# Patient Record
Sex: Female | Born: 1960 | Race: Black or African American | Hispanic: No | Marital: Single | Smoking: Former smoker
Health system: Southern US, Community
[De-identification: ages and names within clinical notes are randomized; demographics above are authoritative.]

## PROBLEM LIST (undated history)

## (undated) DIAGNOSIS — K8020 Calculus of gallbladder without cholecystitis without obstruction: Secondary | ICD-10-CM

## (undated) HISTORY — DX: Calculus of gallbladder without cholecystitis without obstruction: K80.20

---

## 1985-06-17 HISTORY — PX: CARPAL TUNNEL RELEASE: SHX101

## 1998-08-23 ENCOUNTER — Encounter: Payer: Self-pay | Admitting: General Surgery

## 1998-08-23 ENCOUNTER — Ambulatory Visit (HOSPITAL_COMMUNITY): Admission: RE | Admit: 1998-08-23 | Discharge: 1998-08-23 | Payer: Self-pay | Admitting: General Surgery

## 2000-12-01 ENCOUNTER — Other Ambulatory Visit: Admission: RE | Admit: 2000-12-01 | Discharge: 2000-12-01 | Payer: Self-pay | Admitting: Obstetrics and Gynecology

## 2000-12-01 ENCOUNTER — Encounter (INDEPENDENT_AMBULATORY_CARE_PROVIDER_SITE_OTHER): Payer: Self-pay

## 2001-11-10 ENCOUNTER — Inpatient Hospital Stay (HOSPITAL_COMMUNITY): Admission: AD | Admit: 2001-11-10 | Discharge: 2001-11-10 | Payer: Self-pay | Admitting: Obstetrics and Gynecology

## 2002-03-17 ENCOUNTER — Inpatient Hospital Stay (HOSPITAL_COMMUNITY): Admission: AD | Admit: 2002-03-17 | Discharge: 2002-03-20 | Payer: Self-pay | Admitting: Obstetrics and Gynecology

## 2002-07-16 ENCOUNTER — Ambulatory Visit (HOSPITAL_BASED_OUTPATIENT_CLINIC_OR_DEPARTMENT_OTHER): Admission: RE | Admit: 2002-07-16 | Discharge: 2002-07-16 | Payer: Self-pay | Admitting: Orthopedic Surgery

## 2004-02-15 ENCOUNTER — Ambulatory Visit (HOSPITAL_COMMUNITY): Admission: RE | Admit: 2004-02-15 | Discharge: 2004-02-15 | Payer: Self-pay | Admitting: Obstetrics and Gynecology

## 2004-02-22 ENCOUNTER — Encounter: Admission: RE | Admit: 2004-02-22 | Discharge: 2004-02-22 | Payer: Self-pay | Admitting: Obstetrics and Gynecology

## 2005-01-09 ENCOUNTER — Other Ambulatory Visit: Admission: RE | Admit: 2005-01-09 | Discharge: 2005-01-09 | Payer: Self-pay | Admitting: Family Medicine

## 2005-02-21 ENCOUNTER — Encounter: Admission: RE | Admit: 2005-02-21 | Discharge: 2005-02-21 | Payer: Self-pay | Admitting: Family Medicine

## 2006-03-06 ENCOUNTER — Other Ambulatory Visit: Admission: RE | Admit: 2006-03-06 | Discharge: 2006-03-06 | Payer: Self-pay | Admitting: Family Medicine

## 2006-03-26 ENCOUNTER — Encounter: Admission: RE | Admit: 2006-03-26 | Discharge: 2006-03-26 | Payer: Self-pay | Admitting: Family Medicine

## 2007-07-27 ENCOUNTER — Encounter: Admission: RE | Admit: 2007-07-27 | Discharge: 2007-07-27 | Payer: Self-pay | Admitting: Physician Assistant

## 2007-07-27 ENCOUNTER — Other Ambulatory Visit: Admission: RE | Admit: 2007-07-27 | Discharge: 2007-07-27 | Payer: Self-pay | Admitting: Family Medicine

## 2008-07-27 ENCOUNTER — Other Ambulatory Visit: Admission: RE | Admit: 2008-07-27 | Discharge: 2008-07-27 | Payer: Self-pay | Admitting: Family Medicine

## 2008-08-08 ENCOUNTER — Encounter: Admission: RE | Admit: 2008-08-08 | Discharge: 2008-08-08 | Payer: Self-pay | Admitting: Family Medicine

## 2009-04-19 ENCOUNTER — Encounter: Admission: RE | Admit: 2009-04-19 | Discharge: 2009-04-19 | Payer: Self-pay | Admitting: Family Medicine

## 2009-08-09 ENCOUNTER — Encounter: Admission: RE | Admit: 2009-08-09 | Discharge: 2009-08-09 | Payer: Self-pay | Admitting: Family Medicine

## 2010-07-07 ENCOUNTER — Encounter: Payer: Self-pay | Admitting: Family Medicine

## 2010-08-24 ENCOUNTER — Other Ambulatory Visit: Payer: Self-pay | Admitting: Family Medicine

## 2010-08-24 DIAGNOSIS — Z1231 Encounter for screening mammogram for malignant neoplasm of breast: Secondary | ICD-10-CM

## 2010-08-31 ENCOUNTER — Other Ambulatory Visit: Payer: Self-pay | Admitting: Family Medicine

## 2010-08-31 ENCOUNTER — Ambulatory Visit
Admission: RE | Admit: 2010-08-31 | Discharge: 2010-08-31 | Disposition: A | Discharge status: Home / Self Care | Payer: BC Managed Care – PPO | Source: Ambulatory Visit | Attending: Family Medicine | Admitting: Family Medicine

## 2010-08-31 DIAGNOSIS — N6325 Unspecified lump in the left breast, overlapping quadrants: Secondary | ICD-10-CM

## 2010-08-31 DIAGNOSIS — Z1231 Encounter for screening mammogram for malignant neoplasm of breast: Secondary | ICD-10-CM

## 2010-09-07 ENCOUNTER — Other Ambulatory Visit: Payer: Self-pay | Admitting: Family Medicine

## 2010-09-07 ENCOUNTER — Ambulatory Visit
Admission: RE | Admit: 2010-09-07 | Discharge: 2010-09-07 | Disposition: A | Discharge status: Home / Self Care | Payer: BC Managed Care – PPO | Source: Ambulatory Visit | Attending: Family Medicine | Admitting: Family Medicine

## 2010-09-07 DIAGNOSIS — N6325 Unspecified lump in the left breast, overlapping quadrants: Secondary | ICD-10-CM

## 2010-10-10 ENCOUNTER — Other Ambulatory Visit (HOSPITAL_COMMUNITY)
Admission: RE | Admit: 2010-10-10 | Discharge: 2010-10-10 | Disposition: A | Discharge status: Home / Self Care | Payer: BC Managed Care – PPO | Source: Ambulatory Visit | Attending: Family Medicine | Admitting: Family Medicine

## 2010-10-10 DIAGNOSIS — Z0000 Encounter for general adult medical examination without abnormal findings: Secondary | ICD-10-CM | POA: Insufficient documentation

## 2010-11-02 NOTE — Op Note (Signed)
   NAME:  Marissa Silva, Marissa Silva                          ACCOUNT NO.:  [id number]   MEDICAL RECORD NO.:  [id number]                   PATIENT TYPE:  INP   LOCATION:  9161                                 FACILITY:  [facility]   PHYSICIAN:  Lenoard Aden, M.D.             DATE OF BIRTH:  02-07-1961   DATE OF PROCEDURE:  03/18/2002  DATE OF DISCHARGE:                                 OPERATIVE REPORT   INDICATION FOR OPERATIVE DELIVERY:  Nonreassuring fetal heart rate tracing.   PROCEDURE:  Outlet vacuum-assisted vaginal delivery.   DESCRIPTION OF PROCEDURE:  After being apprised of the risks and benefits of  vacuum assistance which includes possible risk of scalp lacerations,  subdural hematoma, intracranial hemorrhage, the patient and grandmother who  are present consent to the performance of vacuum assistance.  Fetal vertex  LOA less than 25 degrees, +3 to +4 station, catheter previously removed,  within five minutes of vacuum assistance recommendation.  Fetal heart tones  noted to be in the 150-160 beat per minute range with poor beat-to-beat  variability indication of late to variable decelerations.  The Mityvac  mushroom cup is placed for one pull over second degree midline laceration.  Full-term living female, Apgars 8 and 9.  Cord blood collected.  Unable to  cord pH due to clots in the umbilical area.  Placenta delivered  spontaneously and intact three-vessel cord noted.  No vaginal or cervical  lacerations noted.  Repair using a 3-0 and 2-0 Rapide.  No complications.  Estimated blood loss 600 cc.  Bulb suctioning performed after delivery of  the fetal vertex.  Mother and baby recovering.                                                Lenoard Aden, M.D.    RJT/MEDQ  D:  03/18/2002  T:  03/18/2002  Job:  [id number]

## 2010-11-02 NOTE — Op Note (Signed)
NAME:  Marissa Silva, Marissa Silva                          ACCOUNT NO.:  [id number]   MEDICAL RECORD NO.:  [id number]                   PATIENT TYPE:  AMB   LOCATION:  DSC                                  FACILITY:  [facility]   PHYSICIAN:  Katy Fitch. Naaman Plummer., M.D.          DATE OF BIRTH:  [birth year]   DATE OF PROCEDURE:  07/16/2002  DATE OF DISCHARGE:                                 OPERATIVE REPORT   PREOPERATIVE DIAGNOSIS:  Stenosing tenosynovitis, left first dorsal  compartment.   POSTOPERATIVE DIAGNOSIS:  Stenosing tenosynovitis, left first dorsal  compartment.   OPERATION:  Release of left first dorsal compartment.   OPERATING SURGEON:  Katy Fitch. Sypher, M.D.   ASSISTANT:  Jonni Sanger, P.A.   ANESTHESIA:  12% Marcaine and 2% Lidocaine field block, left wrist,  supplemented by oxidation, supervised by Dr. Gelene Mink.   INDICATIONS:  Marissa Silva is a 50 year old woman who has a history of  painful swelling on the radial aspect of her left wrist.  She has failed  nonoperative measures including splinting, anti-inflammatory medication and  activity modification.   Due to the failure to respond to nonoperative measures, she is brought to  the operating room at this time for release of her left first dorsal  compartment.   PROCEDURE:  Marissa Silva is brought to the operating room and placed in the  supine position on the operating table.  Following light sedation, the left  arm was prepped with Betadine solution and sterilely draped.  On  exsanguination of the arm with the Esmarch bandage, our total tourniquet on  the proximal brachium was inflated to 220 mmHg.  Surgery commenced with a  transverse incision directly over the palpably enlarged first dorsal  compartment.  Subcutaneous tissue was carefully divided, revealed the radial  sensory branches.  There were gently retracted.  The enlarged compartment  was easily identified.  The compartment was split longitudinally with  scalpel and scissors revealing two slips of the abductor pollicis longus.  Dorsal resection revealed a second dorsal compartment with a thick septum  between the extensor pollicis brevis and abductor pollicis longus tendons.  The dorsal compartment was split longitudinally and the septum between the  compartments resected.  Thereafter, the tendons regained full range of  motion with free glide.   The wound was inspected for bleeding points and subsequently repaired with  intradermal 3-0 Prolene and a Steri-Strip.  __________ compression was  applied with sterile gauze and Ace wrap.  There were no apparent  complications.    AFTERCARE:  Marissa Silva is given prescription for Vicodin 5 mg 1-2 tablet p.o.  q.4-6h. p.r.n. pain, 20 tablets without refill.  She will return to our  office in followup in 7-10 days.  Katy Fitch Naaman Plummer., M.D.    RVS/MEDQ  D:  07/16/2002  T:  07/16/2002  Job:  [id number]

## 2011-05-03 ENCOUNTER — Other Ambulatory Visit: Payer: Self-pay | Admitting: Gastroenterology

## 2011-05-03 DIAGNOSIS — R109 Unspecified abdominal pain: Secondary | ICD-10-CM

## 2011-05-06 ENCOUNTER — Ambulatory Visit
Admission: RE | Admit: 2011-05-06 | Discharge: 2011-05-06 | Disposition: A | Discharge status: Home / Self Care | Payer: BC Managed Care – PPO | Source: Ambulatory Visit | Attending: Gastroenterology | Admitting: Gastroenterology

## 2011-05-06 DIAGNOSIS — R109 Unspecified abdominal pain: Secondary | ICD-10-CM

## 2011-05-21 ENCOUNTER — Ambulatory Visit (INDEPENDENT_AMBULATORY_CARE_PROVIDER_SITE_OTHER): Payer: BC Managed Care – PPO | Admitting: General Surgery

## 2011-05-21 ENCOUNTER — Encounter (INDEPENDENT_AMBULATORY_CARE_PROVIDER_SITE_OTHER): Payer: Self-pay | Admitting: General Surgery

## 2011-05-21 VITALS — BP 142/88 | HR 72 | Temp 97.6°F | Resp 18 | Ht 63.0 in | Wt 183.6 lb

## 2011-05-21 DIAGNOSIS — K8020 Calculus of gallbladder without cholecystitis without obstruction: Secondary | ICD-10-CM

## 2011-05-21 NOTE — H&P (Signed)
Demographics  Specialty CommentsEditShow AllReport    TEARSA KOWALEWSKI 50 year old female  Comm Pref: None [address]  [place] [address] [phone] (M)  Works at [employer] 05/21/11-Pt signed PHI: Madaline Guthrie Thompson(Mother)-02/02/42---  Problem List   MedicationsLong-Term       None  None  Significant History/Details   &&&Relevant Labs (3 years)      None   Smoking: Former Smoker    Smokeless Tobacco: Unknown    Alcohol: No    No open orders    Language: English            &&&Relevant Encounters (Maximum of 10 visits)   Date Type Department Provider Description  08/23/1998 [facility] Conversion Encounter DEFAULT LOCATION Iona Coach, MD         My Last Outpatient Progress Note   Status Last Edited Encounter Date  Pended Tue May 21, 2011 5:11 PM EST 05/21/2011  Subjective:    Patient ID: Marden Noble, female DOB: 1960/09/12, 50 y.o. MRN: [id number]  HPIMrs. Ruthellen Tippy is a 50 year old female who was referred to stand down for symptomatic gallstones patient states that she didn't notice an epigastric discomfort usually following eating and bloating of her abdomen for some months and she had a severe episode of vomiting fried chicken in October and they had tried all probiotics that did not make any change in her symptoms. She was written a same Merri Brunette and she referred the patient to stay in Dr Evette Cristal and he felt that her symptoms were most likely related to her gallbladder pain ultrasound did show multiple stones within her gallbladder. The common bile duct was not dilated and he recommended she see a surgeon for cholecystectomy  Past medical history she previously has been a cigarette smoking quit about 1990 she works for [employer] and she has one daughter age 52 the patient is single and is not known whether there is a lot of family members with gallbladder disease or not she has not had a recent  chest x-ray but denies any shortness of breath asthma. Her last menstrual period was over a year ago and most likely she is going through menopause denies any urinary type symptoms. Not sure that she's had any blood work since I don't have access to it and she denies any hypertension or angina-type symptoms.  Review of Systems    No current outpatient prescriptions on file.     No Known Allergies    Past Surgical History    Procedure  Date    .  Carpal tunnel release  1987      bilateral         Objective:     Physical ExamBP 142/88  Pulse 72  Temp(Src) 97.6 F (36.4 C) (Temporal)  Resp 18  Ht 5\' 3"  (1.6 m)  Wt 183 lb 9.6 oz (83.28 kg)  BMI 32.52 kg/m2  His examination shows a female appears her stated age in no acute distress he describes pain so in the mid epigastric area and sometimes goes up between her breasts anesthesia occurs after eating oral examination is unremarkable there is no carotid bruit or some eyes ears nose and throat are negative good breath sounds bilaterally I did not do a breast exam. Cardiac normal sinus rhythm abdomen she's not acutely tender pressure in the right upper quadrant and I don't appreciate any umbilical or  hernias in the lower abdomen. I did not do a rectal pelvic examination normal. She's got a history of lower back pain but I think the pain is lower than I would expect if there would be any common duct stones..  I think she's got symptomatic gallstones and I would recommend a laparoscopic cholecystectomy with cholangiogram at [facility] in the near future. The patient states that she is dizzy it worked until after the exams but should be able to get her surgery done for Christmas and will arrange to see if somebody could plan will be off her the night of surgery so she could go home the day of surgery provided that there were no stones in the gallbladder      Assessment:      Symptomatic gallstones I reviewed the patient's ultrasound with her and also  reve little booklet on biliary he has a copy of that booklet. She is not allergic to penicillin and sugar talked toe first at this time      Plan:      See note above      Care Team and Communications    Referring Provider    Graylin Shiver    PCPs Type   Candace Darolyn [facility]   Other Patient Care Team Members Relationship   None    Visit Treatment Team Relationship   Iona Coach, MD N/A   Recipients of Past Communications    None

## 2011-05-21 NOTE — Progress Notes (Signed)
Subjective:     Patient ID: Marissa Silva, female   DOB: [birth year], 50 y.o.   MRN: [id number]  HPIMrs. Marissa Silva is a 50 year old female who was referred to stand down for symptomatic gallstones patient states that she didn't notice an epigastric discomfort usually following eating and bloating of her abdomen for some months and she had a severe episode of vomiting fried chicken in October and they had tried all probiotics that did not make any change in her symptoms. She was written a same Marissa Brunette and she referred the patient to stay in Dr Evette Cristal and he felt that her symptoms were most likely related to her gallbladder pain ultrasound did show multiple stones within her gallbladder. The common bile duct was not dilated and he recommended she see a surgeon for cholecystectomy  Past medical history she previously has been a cigarette smoking quit about 1990 she works for him [employer] and she has one daughter age 42 the patient is single and is not known whether there is a lot of family members with gallbladder disease or not she has not had a recent chest x-ray but denies any shortness of breath asthma. Her last menstrual period was over a year ago and most likely she is going through menopause denies any urinary type symptoms. Not sure that she's had any blood work since I don't have access to it and she denies any hypertension or angina-type symptoms.   Review of Systems No current outpatient prescriptions on file.   No Known Allergies Past Surgical History  Procedure Date  . Carpal tunnel release 1987    bilateral       Objective:   Physical ExamBP 142/88  Pulse 72  Temp(Src) 97.6 F (36.4 C) (Temporal)  Resp 18  Ht 5\' 3"  (1.6 m)  Wt 183 lb 9.6 oz (83.28 kg)  BMI 32.52 kg/m2 His examination shows a female appears her stated age in no acute distress he describes pain so in the mid epigastric area and sometimes goes up between her breasts anesthesia occurs after  eating oral examination is unremarkable there is no carotid bruit or some eyes ears nose and throat are negative good breath sounds bilaterally I did not do a breast exam. Cardiac normal sinus rhythm abdomen she's not acutely tender pressure in the right upper quadrant and I don't appreciate any umbilical or hernias in the lower abdomen. I did not do a rectal pelvic examination normal. She's got a history of lower back pain but I think the pain is lower than I would expect if there would be any common duct stones..  I think she's got symptomatic gallstones and I would recommend a laparoscopic cholecystectomy with cholangiogram at [facility] in the near future. The patient states that she is dizzy it worked until after the exams but should be able to get her surgery done for Christmas and will arrange to see if somebody could plan will be off her the night of surgery so she could go home the day of surgery provided that there were no stones in the gallbladder     Assessment:     Symptomatic gallstones I reviewed the patient's ultrasound with her and also reve little booklet on biliary he has a copy of that booklet. She is not allergic to penicillin and sugar talked toe first at this time    Plan:     See note above

## 2011-05-28 ENCOUNTER — Encounter (HOSPITAL_COMMUNITY): Payer: Self-pay | Admitting: Pharmacy Technician

## 2011-05-30 ENCOUNTER — Other Ambulatory Visit: Payer: Self-pay

## 2011-05-30 ENCOUNTER — Encounter (HOSPITAL_COMMUNITY)
Admission: RE | Admit: 2011-05-30 | Discharge: 2011-05-30 | Disposition: A | Discharge status: Home / Self Care | Payer: BC Managed Care – PPO | Source: Ambulatory Visit | Attending: General Surgery | Admitting: General Surgery

## 2011-05-30 ENCOUNTER — Encounter (HOSPITAL_COMMUNITY): Payer: Self-pay

## 2011-05-30 DIAGNOSIS — K8020 Calculus of gallbladder without cholecystitis without obstruction: Secondary | ICD-10-CM

## 2011-05-30 LAB — COMPREHENSIVE METABOLIC PANEL
ALT: 24 U/L (ref 0–35)
AST: 17 U/L (ref 0–37)
Albumin: 4 g/dL (ref 3.5–5.2)
Alkaline Phosphatase: 58 U/L (ref 39–117)
BUN: 13 mg/dL (ref 6–23)
Calcium: 9.9 mg/dL (ref 8.4–10.5)
Creatinine, Ser: 0.81 mg/dL (ref 0.50–1.10)
GFR calc Af Amer: >90 mL/min (ref >90)
Potassium: 4.2 mEq/L (ref 3.5–5.1)
Total Protein: 7.9 g/dL (ref 6.0–8.3)

## 2011-05-30 LAB — DIFFERENTIAL
Eosinophils Relative: 1 % (ref 0–5)
Lymphocytes Relative: 47 % — ABNORMAL HIGH (ref 12–46)
Lymphs Abs: 2.4 10*3/uL (ref 0.7–4.0)
Monocytes Relative: 7 % (ref 3–12)
Neutro Abs: 2.3 10*3/uL (ref 1.7–7.7)

## 2011-05-30 LAB — CBC
MCHC: 33.3 g/dL (ref 30.0–36.0)
RDW: 12.9 % (ref 11.5–15.5)

## 2011-05-30 NOTE — Patient Instructions (Signed)
20 Marissa Silva  05/30/2011   Your procedure is scheduled on:  Friday 05/31/11 AT 7:45 AM  Report to [facility] at 5:45 AM.  Call this number if you have problems the morning of surgery: [phone]   Remember:   Do not eat food OR DRINK ANYTHING AFTER MIDNIGHT-THE NIGHT BEFORE YOUR SURGERY.       Do not wear jewelry, make-up or nail polish.  Do not wear lotions, powders, or perfumes. You may wear deodorant.  Do not shave 48 hours prior to surgery.  Do not bring valuables to the hospital.  Contacts, dentures or bridgework may not be worn into surgery.  Leave suitcase in the car. After surgery it may be brought to your room.  For patients admitted to the hospital, checkout time is 11:00 AM the day of discharge.   Patients discharged the day of surgery will not be allowed to drive home.  Name and phone number of your driver: PATIENT'S MOTHER WILL BE WITH HER  Special Instructions: CHG Shower Use Special Wash: 1/2 bottle night before surgery and 1/2 bottle morning of surgery.   Please read over the following fact sheets that you were given: MRSA Information

## 2011-05-30 NOTE — Pre-Procedure Instructions (Signed)
EKG WAS DONE TODAY IN PREOP AT [facility] AS PER ORDER DR. Zachery Dakins CXR NOT NEEDED-PER ANESTHESIOLOGIST GUIDELINES

## 2011-05-31 ENCOUNTER — Ambulatory Visit (HOSPITAL_COMMUNITY): Payer: BC Managed Care – PPO

## 2011-05-31 ENCOUNTER — Encounter (HOSPITAL_COMMUNITY)
Admission: RE | Disposition: A | Discharge status: Home / Self Care | Payer: Self-pay | Source: Ambulatory Visit | Attending: General Surgery

## 2011-05-31 ENCOUNTER — Encounter (HOSPITAL_COMMUNITY): Payer: Self-pay | Admitting: *Deleted

## 2011-05-31 ENCOUNTER — Other Ambulatory Visit (INDEPENDENT_AMBULATORY_CARE_PROVIDER_SITE_OTHER): Payer: Self-pay | Admitting: General Surgery

## 2011-05-31 ENCOUNTER — Encounter (HOSPITAL_COMMUNITY): Payer: Self-pay | Admitting: Anesthesiology

## 2011-05-31 ENCOUNTER — Ambulatory Visit (HOSPITAL_COMMUNITY): Payer: BC Managed Care – PPO | Admitting: Anesthesiology

## 2011-05-31 ENCOUNTER — Ambulatory Visit (HOSPITAL_COMMUNITY)
Admission: RE | Admit: 2011-05-31 | Discharge: 2011-05-31 | Disposition: A | Discharge status: Home / Self Care | Payer: BC Managed Care – PPO | Source: Ambulatory Visit | Attending: General Surgery | Admitting: General Surgery

## 2011-05-31 DIAGNOSIS — R1110 Vomiting, unspecified: Secondary | ICD-10-CM | POA: Insufficient documentation

## 2011-05-31 DIAGNOSIS — K8010 Calculus of gallbladder with chronic cholecystitis without obstruction: Secondary | ICD-10-CM | POA: Insufficient documentation

## 2011-05-31 DIAGNOSIS — M545 Low back pain, unspecified: Secondary | ICD-10-CM | POA: Insufficient documentation

## 2011-05-31 DIAGNOSIS — K8020 Calculus of gallbladder without cholecystitis without obstruction: Secondary | ICD-10-CM

## 2011-05-31 DIAGNOSIS — R1013 Epigastric pain: Secondary | ICD-10-CM | POA: Insufficient documentation

## 2011-05-31 DIAGNOSIS — K838 Other specified diseases of biliary tract: Secondary | ICD-10-CM | POA: Insufficient documentation

## 2011-05-31 DIAGNOSIS — R42 Dizziness and giddiness: Secondary | ICD-10-CM | POA: Insufficient documentation

## 2011-05-31 DIAGNOSIS — Z01810 Encounter for preprocedural cardiovascular examination: Secondary | ICD-10-CM | POA: Insufficient documentation

## 2011-05-31 DIAGNOSIS — Z01812 Encounter for preprocedural laboratory examination: Secondary | ICD-10-CM | POA: Insufficient documentation

## 2011-05-31 HISTORY — PX: CHOLECYSTECTOMY: SHX55

## 2011-05-31 SURGERY — LAPAROSCOPIC CHOLECYSTECTOMY WITH INTRAOPERATIVE CHOLANGIOGRAM
Anesthesia: General | Site: Abdomen | Wound class: Clean Contaminated

## 2011-05-31 MED ORDER — NEOSTIGMINE METHYLSULFATE 1 MG/ML IJ SOLN
INTRAMUSCULAR | Status: DC | PRN
  Administered 2011-05-31: 4 mg via INTRAVENOUS

## 2011-05-31 MED ORDER — ONDANSETRON HCL 4 MG/2ML IJ SOLN
INTRAMUSCULAR | Status: DC | PRN
  Administered 2011-05-31: 4 mg via INTRAVENOUS

## 2011-05-31 MED ORDER — HYDROMORPHONE HCL PF 1 MG/ML IJ SOLN
0.2500 mg | INTRAMUSCULAR | Status: DC | PRN
  Administered 2011-05-31 (×4): 0.5 mg via INTRAVENOUS

## 2011-05-31 MED ORDER — LACTATED RINGERS IV SOLN: INTRAVENOUS | Status: DC

## 2011-05-31 MED ORDER — GLYCOPYRROLATE 0.2 MG/ML IJ SOLN
INTRAMUSCULAR | Status: DC | PRN
  Administered 2011-05-31: .6 mg via INTRAVENOUS

## 2011-05-31 MED ORDER — FENTANYL CITRATE 0.05 MG/ML IJ SOLN
INTRAMUSCULAR | Status: DC | PRN
  Administered 2011-05-31: 50 ug via INTRAVENOUS
  Administered 2011-05-31 (×2): 100 ug via INTRAVENOUS

## 2011-05-31 MED ORDER — ROCURONIUM BROMIDE 100 MG/10ML IV SOLN
INTRAVENOUS | Status: DC | PRN
  Administered 2011-05-31: 50 mg via INTRAVENOUS

## 2011-05-31 MED ORDER — HYDROMORPHONE HCL PF 1 MG/ML IJ SOLN
INTRAMUSCULAR | Status: AC
  Filled 2011-05-31: qty 1

## 2011-05-31 MED ORDER — IOHEXOL 300 MG/ML  SOLN
INTRAMUSCULAR | Status: DC | PRN
  Administered 2011-05-31: 3 mL via INTRAVENOUS

## 2011-05-31 MED ORDER — HYDROCODONE-ACETAMINOPHEN 5-500 MG PO TABS: 1.0000 | ORAL_TABLET | ORAL | Status: AC | PRN

## 2011-05-31 MED ORDER — ACETAMINOPHEN 10 MG/ML IV SOLN
INTRAVENOUS | Status: DC | PRN
  Administered 2011-05-31: 1000 mg via INTRAVENOUS

## 2011-05-31 MED ORDER — ACETAMINOPHEN 10 MG/ML IV SOLN
INTRAVENOUS | Status: AC
  Filled 2011-05-31: qty 100

## 2011-05-31 MED ORDER — PROMETHAZINE HCL 25 MG/ML IJ SOLN: 6.2500 mg | INTRAMUSCULAR | Status: DC | PRN

## 2011-05-31 MED ORDER — DEXTROSE 5 % IV SOLN
2.0000 g | INTRAVENOUS | Status: AC
  Administered 2011-05-31: 2 g via INTRAVENOUS
  Filled 2011-05-31: qty 2

## 2011-05-31 MED ORDER — PROPOFOL 10 MG/ML IV EMUL
INTRAVENOUS | Status: DC | PRN
  Administered 2011-05-31: 200 mg via INTRAVENOUS

## 2011-05-31 MED ORDER — IOHEXOL 300 MG/ML  SOLN
INTRAMUSCULAR | Status: AC
  Filled 2011-05-31: qty 1

## 2011-05-31 MED ORDER — LIDOCAINE HCL (CARDIAC) 20 MG/ML IV SOLN
INTRAVENOUS | Status: DC | PRN
  Administered 2011-05-31: 80 mg via INTRAVENOUS

## 2011-05-31 MED ORDER — MIDAZOLAM HCL 5 MG/5ML IJ SOLN
INTRAMUSCULAR | Status: DC | PRN
  Administered 2011-05-31: 2 mg via INTRAVENOUS

## 2011-05-31 MED ORDER — CEFOXITIN SODIUM-DEXTROSE 1-4 GM-% IV SOLR (PREMIX)
INTRAVENOUS | Status: AC
  Filled 2011-05-31: qty 100

## 2011-05-31 MED ORDER — LACTATED RINGERS IV SOLN
INTRAVENOUS | Status: DC | PRN
  Administered 2011-05-31: 07:00:00 via INTRAVENOUS

## 2011-05-31 MED ORDER — BUPIVACAINE-EPINEPHRINE 0.25% -1:200000 IJ SOLN
INTRAMUSCULAR | Status: AC
  Filled 2011-05-31: qty 1

## 2011-05-31 SURGICAL SUPPLY — 35 items
APL SKNCLS STERI-STRIP NONHPOA (GAUZE/BANDAGES/DRESSINGS) ×1
APPLIER CLIP ROT 10 11.4 M/L (STAPLE) ×2
APR CLP MED LRG 11.4X10 (STAPLE) ×1
BAG SPEC RTRVL LRG 6X4 10 (ENDOMECHANICALS) ×1
BENZOIN TINCTURE PRP APPL 2/3 (GAUZE/BANDAGES/DRESSINGS) ×2 IMPLANT
CANISTER SUCTION 2500CC (MISCELLANEOUS) ×2 IMPLANT
CLIP APPLIE ROT 10 11.4 M/L (STAPLE) ×1 IMPLANT
CLOTH BEACON ORANGE TIMEOUT ST (SAFETY) ×2 IMPLANT
COVER MAYO STAND STRL (DRAPES) ×2 IMPLANT
DECANTER SPIKE VIAL GLASS SM (MISCELLANEOUS) ×2 IMPLANT
DRAPE C-ARM 42X72 X-RAY (DRAPES) ×2 IMPLANT
DRAPE LAPAROSCOPIC ABDOMINAL (DRAPES) ×2 IMPLANT
ELECT REM PT RETURN 9FT ADLT (ELECTROSURGICAL) ×2
ELECTRODE REM PT RTRN 9FT ADLT (ELECTROSURGICAL) ×1 IMPLANT
GLOVE BIOGEL PI IND STRL 7.0 (GLOVE) ×1 IMPLANT
GLOVE BIOGEL PI INDICATOR 7.0 (GLOVE) ×1
GLOVE ORTHO TXT STRL SZ7.5 (GLOVE) ×2 IMPLANT
GOWN PREVENTION PLUS XLARGE (GOWN DISPOSABLE) ×2 IMPLANT
GOWN STRL NON-REIN LRG LVL3 (GOWN DISPOSABLE) ×3 IMPLANT
GOWN STRL REIN XL XLG (GOWN DISPOSABLE) ×3 IMPLANT
HEMOSTAT SURGICEL 4X8 (HEMOSTASIS) IMPLANT
KIT BASIN OR (CUSTOM PROCEDURE TRAY) ×2 IMPLANT
NS IRRIG 1000ML POUR BTL (IV SOLUTION) ×1 IMPLANT
POUCH SPECIMEN RETRIEVAL 10MM (ENDOMECHANICALS) ×2 IMPLANT
SET CHOLANGIOGRAPH MIX (MISCELLANEOUS) ×2 IMPLANT
SET IRRIG TUBING LAPAROSCOPIC (IRRIGATION / IRRIGATOR) ×2 IMPLANT
SOLUTION ANTI FOG 6CC (MISCELLANEOUS) ×2 IMPLANT
STRIP CLOSURE SKIN 1/2X4 (GAUZE/BANDAGES/DRESSINGS) ×2 IMPLANT
SUT VIC AB 0 UR5 27 (SUTURE) ×1 IMPLANT
SUT VIC AB 4-0 PS2 27 (SUTURE) ×2 IMPLANT
TRAY LAP CHOLE (CUSTOM PROCEDURE TRAY) ×2 IMPLANT
TROCAR BLADELESS OPT 5 75 (ENDOMECHANICALS) ×4 IMPLANT
TROCAR XCEL BLUNT TIP 100MML (ENDOMECHANICALS) ×2 IMPLANT
TROCAR XCEL NON-BLD 11X100MML (ENDOMECHANICALS) ×2 IMPLANT
TUBING INSUFFLATION 10FT LAP (TUBING) ×2 IMPLANT

## 2011-05-31 NOTE — Transfer of Care (Signed)
Immediate Anesthesia Transfer of Care Note  Patient: Marissa Silva  Procedure(s) Performed:  LAPAROSCOPIC CHOLECYSTECTOMY WITH INTRAOPERATIVE CHOLANGIOGRAM  Patient Location: PACU  Anesthesia Type: General  Level of Consciousness: sedated and patient cooperative  Airway & Oxygen Therapy: Patient Spontanous Breathing and Patient connected to face mask oxygen  Post-op Assessment: Report given to PACU RN, Post -op Vital signs reviewed and stable and Patient moving all extremities  Post vital signs: Reviewed and stable  Complications: No apparent anesthesia complications

## 2011-05-31 NOTE — Anesthesia Preprocedure Evaluation (Addendum)
Anesthesia Evaluation  Patient identified by MRN, date of birth, ID band Patient awake    Reviewed: Allergy & Precautions, H&P , NPO status , Patient's Chart, lab work & pertinent test results  Airway Mallampati: II TM Distance: >3 FB Neck ROM: full    Dental No notable dental hx. (+) Teeth Intact   Pulmonary neg pulmonary ROS,  clear to auscultation  Pulmonary exam normal       Cardiovascular Exercise Tolerance: Good neg cardio ROS regular Normal    Neuro/Psych Negative Neurological ROS  Negative Psych ROS   GI/Hepatic negative GI ROS, Neg liver ROS, PUD,   Endo/Other  Negative Endocrine ROS  Renal/GU negative Renal ROS  Genitourinary negative   Musculoskeletal   Abdominal   Peds  Hematology negative hematology ROS (+)   Anesthesia Other Findings   Reproductive/Obstetrics negative OB ROS                          Anesthesia Physical Anesthesia Plan  ASA: I  Anesthesia Plan: General   Post-op Pain Management:    Induction: Intravenous  Airway Management Planned: Oral ETT  Additional Equipment:   Intra-op Plan:   Post-operative Plan: Extubation in OR  Informed Consent: I have reviewed the patients History and Physical, chart, labs and discussed the procedure including the risks, benefits and alternatives for the proposed anesthesia with the patient or authorized representative who has indicated his/her understanding and acceptance.   Dental Advisory Given  Plan Discussed with: CRNA and Surgeon  Anesthesia Plan Comments:        Anesthesia Quick Evaluation

## 2011-05-31 NOTE — H&P (View-Only) (Signed)
      Demographics  Specialty CommentsEditShow AllReport    Marissa Silva 50 year old female  Comm Pref: None [address]  [place] [address] [phone] (H) [phone] (W) [phone] (M)  Works at [employer] 05/21/11-Pt signed PHI: Marissa Silva(Mother)-[birth year]42---  Problem List   MedicationsLong-Term       None  None  Significant History/Details   &&&Relevant Labs (3 years)      None   Smoking: Former Smoker    Smokeless Tobacco: Unknown    Alcohol: No    No open orders    Language: English            &&&Relevant Encounters (Maximum of 10 visits)   Date Type Department Provider Description  08/23/1998 [facility] Conversion Encounter DEFAULT LOCATION Rhanda Lemire J, MD         My Last Outpatient Progress Note   Status Last Edited Encounter Date  Pended Tue May 21, 2011 5:11 PM EST 05/21/2011  Subjective:    Patient ID: Marissa Silva, female DOB: 02/21/1961, 50 y.o. MRN: [id number]  HPIMrs. Marissa Silva is a 50-year-old female who was referred to stand down for symptomatic gallstones patient states that she didn't notice an epigastric discomfort usually following eating and bloating of her abdomen for some months and she had a severe episode of vomiting fried chicken in October and they had tried all probiotics that did not make any change in her symptoms. She was written a same Candace Jiron and she referred the patient to stay in Dr Ganem and he felt that her symptoms were most likely related to her gallbladder pain ultrasound did show multiple stones within her gallbladder. The common bile duct was not dilated and he recommended she see a surgeon for cholecystectomy  Past medical history she previously has been a cigarette smoking quit about 1990 she works for him [employer] and she has one daughter age 9 the patient is single and is not known whether there is a lot of family members with gallbladder disease or not she has not had a recent  chest x-ray but denies any shortness of breath asthma. Her last menstrual period was over a year ago and most likely she is going through menopause denies any urinary type symptoms. Not sure that she's had any blood work since I don't have access to it and she denies any hypertension or angina-type symptoms.  Review of Systems    No current outpatient prescriptions on file.     No Known Allergies    Past Surgical History    Procedure  Date    .  Carpal tunnel release  1987      bilateral         Objective:     Physical ExamBP 142/88  Pulse 72  Temp(Src) 97.6 F (36.4 C) (Temporal)  Resp 18  Ht 5' 3" (1.6 m)  Wt 183 lb 9.6 oz (83.28 kg)  BMI 32.52 kg/m2  His examination shows a female appears her stated age in no acute distress he describes pain so in the mid epigastric area and sometimes goes up between her breasts anesthesia occurs after eating oral examination is unremarkable there is no carotid bruit or some eyes ears nose and throat are negative good breath sounds bilaterally I did not do a breast exam. Cardiac normal sinus rhythm abdomen she's not acutely tender pressure in the right upper quadrant and I don't appreciate any umbilical or   hernias in the lower abdomen. I did not do a rectal pelvic examination normal. She's got a history of lower back pain but I think the pain is lower than I would expect if there would be any common duct stones..  I think she's got symptomatic gallstones and I would recommend a laparoscopic cholecystectomy with cholangiogram at  in the near future. The patient states that she is dizzy it worked until after the exams but should be able to get her surgery done for Christmas and will arrange to see if somebody could plan will be off her the night of surgery so she could go home the day of surgery provided that there were no stones in the gallbladder      Assessment:      Symptomatic gallstones I reviewed the patient's ultrasound with her and also  reve little booklet on biliary he has a copy of that booklet. She is not allergic to penicillin and sugar talked toe first at this time      Plan:      See note above      Care Team and Communications    Referring Provider    Salem F Ganem    PCPs Type   Candace Thiele [facility]   Other Patient Care Team Members Relationship   None    Visit Treatment Team Relationship   Hussain Maimone J Tieasha Larsen, MD N/A   Recipients of Past Communications    None       

## 2011-05-31 NOTE — Interval H&P Note (Signed)
History and Physical Interval Note:  05/31/2011 7:30 AM  Marissa Silva  has presented today for surgery, with the diagnosis of GALLSTONES  The various methods of treatment have been discussed with the patient and family. After consideration of risks, benefits and other options for treatment, the patient has consented to  Procedure(s): LAPAROSCOPIC CHOLECYSTECTOMY WITH INTRAOPERATIVE CHOLANGIOGRAM as a surgical intervention .  The patients' history has been reviewed, patient examined, no change in status, stable for surgery.  I have reviewed the patients' chart and labs.  Questions were answered to the patient's satisfaction.     Akeiba Axelson J Plan cholecystectomy today no attacks since seening pt in office not tender on reexamination this am. Wants to go home after or. BP 155/85  Pulse 79  Temp(Src) 97.2 F (36.2 C) (Oral)  Resp 18  SpO2 100%  Consuello Bossier

## 2011-05-31 NOTE — Preoperative (Signed)
Beta Blockers   Reason not to administer Beta Blockers:Not Applicable 

## 2011-05-31 NOTE — Op Note (Signed)
[name], [name] NO.:  [id number]  MEDICAL RECORD NO.:  [id number]  LOCATION:  [facility]                         FACILITY:  [facility]  PHYSICIAN:  Anselm Pancoast. Rani Idler, M.D.DATE OF BIRTH:  [birth year]  DATE OF PROCEDURE:  05/31/2011 DATE OF DISCHARGE:                              OPERATIVE REPORT   PREOPERATIVE DIAGNOSIS:  Chronic cholecystitis with stones.  POSTOPERATIVE DIAGNOSIS:  Chronic cholecystitis with stones.  OPERATIONS:  Laparoscopic cholecystectomy with cholangiogram.  SURGEON:  Anselm Pancoast. Zachery Dakins, M.D.  ASSISTANT:  Sandria Bales. Ezzard Standing, M.D.  HISTORY:  Marissa Silva is a 50 year old female who was referred to me due to symptomatic gallstones.  She had seen Dr. Merri Brunette and was having epigastric pain.  She was referred by Dr. __________ for consideration of upper endoscopy, he thought the symptoms were more consistent with gallstones and referred her for an ultrasound that showed multiple stones within her gallbladder.  The common bile duct was not dilated and he recommended that she see a surgeon and was referred to my office.  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  She said she has not had any further episodes of pain since I saw her in the office approximately 2 weeks ago.  Liver function studies were normal and she denied any medical allergies.  She was given 2 g of Mefoxin preoperatively.  On physical exam this morning, there was no evidence of any tenderness in the right upper quadrant, and we discussed that she does plan on going home after her surgery today.  They produced a little booklet in the office, but __________, etc.  The patient was taken to the operative suite.  Induction of general anesthesia, endotracheal tube, the abdomen was prepped with  Betadine solution and draped in sterile manner, after time-out was completed, planned procedure.  She received 2 g of Mefoxin and then a small incision was made below the umbilicus.  The fascia was identified, picked up between 2 Kochers and then a small opening made into the true peritoneal cavity through the peritoneum.  A pursestring suture was placed and a Hasson cannula introduced.  The gallbladder had a lot of stones, but not acutely inflamed, and the upper 10 mm trocar was placed under direct vision after anesthetizing the fascia and the 2 lateral 5- mm trocars were placed at the appropriate lateral position.  Dr. Ezzard Standing had scrubbed in at this time and retracted the gallbladder upward and outward in the proximal portion of the gallbladder cystic duct area.  I resected this and circled the cystic duct, there was a little blood vessel right on the cystic duct, then I elevated it, put 2 clips under it and then divided distally.  I then put a clip on the junction of the cystic duct gallbladder, opened the proximal cystic duct, and there was a little bit of debris __________, but not truly stones.  The catheter was introduced, held in place with clip.  The cholangiogram obtained, it showed  good prompt filling of the extrahepatic biliary system with no evidence of common duct stone and probably about a 3 cm cystic duct remaining.  I removed the catheter, dissected down on the cystic duct about 2 cm more proximal and then triply clipped the cystic duct at that point and then divided it distal to the clips.  All the little branches of the cystic artery were doubly clipped and then divided distal to the clips and then the gallbladder freed from its bed, and there was good hemostasis.  We inspected all the clips, bleeders found none, and thought everything was fine.  We placed the gallbladder in the Endocatch bag.  We then switched the camera to the upper 10-mm trocar and grabbed the bag.   I opened the fascia just a little larger to get the bag intact with the numerous stones and most of these were all kind of 2 to 3-mm type residual stones.  They states that __________ the pursestring came out, where I put 2 figure-of-eights of 0 Vicryl in the fascia and tied both and anesthetized the fascia.  The subcutaneous wounds, after the ports removed, were closed with 4-0 Vicryl and reinspection before removing the last 5-mm port showed no evidence of any bleeding and the remaining irrigating fluid had been aspirated.  The upper 10 mm trocar was withdrawn under direct vision and Steri-Strips and benzoin were placed on the skin.  The patient will be released after a short stay this morning, and I will see her back in the office next Friday for postoperative wound check.     Anselm Pancoast. Zachery Dakins, M.D.     WJW/MEDQ  D:  05/31/2011  T:  05/31/2011  Job:  [id number]  cc:   Dario Guardian, M.D. Fax: [phone]

## 2011-05-31 NOTE — Brief Op Note (Signed)
05/31/2011  9:05 AM  PATIENT:  Marissa Silva  50 y.o. female  PRE-OPERATIVE DIAGNOSIS:  GALLSTONES  POST-OPERATIVE DIAGNOSIS:  GALLSTONES  PROCEDURE:  Procedure(s): LAPAROSCOPIC CHOLECYSTECTOMY WITH INTRAOPERATIVE CHOLANGIOGRAM  SURGEON:  Surgeon(s): Iona Coach, MD    ASSISTANTS:David Ezzard Standing   ANESTHESIA:   general  EBL:     BLOOD ADMINISTERED:none  DRAINS: none   LOCAL MEDICATIONS USED:  MARCAINE 20CC  SPECIMEN:  Excision  DISPOSITION OF SPECIMEN:  PATHOLOGY  COUNTS:  YES  TOURNIQUET:  * No tourniquets in log *  DICTATION: .Other Dictation: Dictation Number [id number]  PLAN OF CARE: Discharge to home after PACU  PATIENT DISPOSITION:  PACU - hemodynamically stable.

## 2011-05-31 NOTE — Anesthesia Postprocedure Evaluation (Signed)
  Anesthesia Post-op Note  Patient: Marissa Silva  Procedure(s) Performed:  LAPAROSCOPIC CHOLECYSTECTOMY WITH INTRAOPERATIVE CHOLANGIOGRAM  Patient Location: PACU  Anesthesia Type: General  Level of Consciousness: awake and alert   Airway and Oxygen Therapy: Patient Spontanous Breathing  Post-op Pain: mild  Post-op Assessment: Post-op Vital signs reviewed, Patient's Cardiovascular Status Stable, Respiratory Function Stable, Patent Airway and No signs of Nausea or vomiting  Post-op Vital Signs: stable  Complications: No apparent anesthesia complications

## 2011-06-03 ENCOUNTER — Telehealth (INDEPENDENT_AMBULATORY_CARE_PROVIDER_SITE_OTHER): Payer: Self-pay

## 2011-06-03 NOTE — Telephone Encounter (Signed)
Patients mother called requesting a follow up appoint for daughter Marissa Silva. Cant see any availability. Please call patient with appointment [phone].

## 2011-06-05 ENCOUNTER — Encounter (HOSPITAL_COMMUNITY): Payer: Self-pay | Admitting: General Surgery

## 2011-06-06 ENCOUNTER — Encounter (INDEPENDENT_AMBULATORY_CARE_PROVIDER_SITE_OTHER): Payer: Self-pay | Admitting: General Surgery

## 2011-06-06 ENCOUNTER — Ambulatory Visit (INDEPENDENT_AMBULATORY_CARE_PROVIDER_SITE_OTHER): Payer: BC Managed Care – PPO | Admitting: General Surgery

## 2011-06-06 VITALS — BP 132/86 | HR 80 | Temp 97.8°F | Resp 16 | Ht 63.0 in | Wt 182.2 lb

## 2011-06-06 DIAGNOSIS — K8010 Calculus of gallbladder with chronic cholecystitis without obstruction: Secondary | ICD-10-CM

## 2011-06-06 NOTE — Progress Notes (Signed)
Patient ID: Marissa Silva, female   DOB: March 07, 1961, 50 y.o.   MRN: [id number] Observe what foods tend to cause loose bowel movements and adjust them as needed. The little incision lumps will dissolve it the next few weeks as the stitches dissolve. If there is any need for a followup visit with one of my partners in approximately one month please call to be reseen. merry Christmas BP 132/86  Pulse 80  Temp(Src) 97.8 F (36.6 C) (Temporal)  Resp 16  Ht 5\' 3"  (1.6 m)  Wt 182 lb 3.2 oz (82.645 kg)  BMI 32.28 kg/m2 [name] returns she is now first postop visit followup chart a cholecystectomy and cholangiogram were performed last week she was released after her surgery she had no stones in the common bile duct a lot of stones in her gallbladder and says since her surgery she's had no pain she has noticed that she has a little loose bowel movements intermittently with certain food. Her incisions are healing without evidence of problem and a little stitch in the subcutaneous tissue should resolve in the next few weeks. If she is still having any symptoms in approximately one month she can return to see one of my partners that I did not schedule her for a followup appointment

## 2011-09-30 ENCOUNTER — Other Ambulatory Visit: Payer: Self-pay | Admitting: Family Medicine

## 2011-09-30 ENCOUNTER — Other Ambulatory Visit (HOSPITAL_COMMUNITY): Payer: Self-pay | Admitting: Family Medicine

## 2011-09-30 DIAGNOSIS — Z1231 Encounter for screening mammogram for malignant neoplasm of breast: Secondary | ICD-10-CM

## 2011-10-11 ENCOUNTER — Ambulatory Visit: Payer: BC Managed Care – PPO

## 2011-10-11 ENCOUNTER — Ambulatory Visit
Admission: RE | Admit: 2011-10-11 | Discharge: 2011-10-11 | Disposition: A | Discharge status: Home / Self Care | Payer: BC Managed Care – PPO | Source: Ambulatory Visit | Attending: Family Medicine | Admitting: Family Medicine

## 2011-10-11 DIAGNOSIS — Z1231 Encounter for screening mammogram for malignant neoplasm of breast: Secondary | ICD-10-CM

## 2011-10-23 ENCOUNTER — Ambulatory Visit (HOSPITAL_COMMUNITY): Payer: BC Managed Care – PPO

## 2012-04-30 ENCOUNTER — Other Ambulatory Visit: Payer: Self-pay | Admitting: Obstetrics and Gynecology

## 2012-04-30 DIAGNOSIS — N644 Mastodynia: Secondary | ICD-10-CM

## 2013-04-21 IMAGING — US US ABDOMEN COMPLETE
1 series · 14 of 25 positions shown · non-contrast
Comparison: None.

CLINICAL DATA: Abdominal pain and discomfort.  Bloating.

COMPLETE ABDOMINAL ULTRASOUND

[Series 1: us abdomen complete · 0.26mm/px · 14 of 50 slices shown]
[im 1/50]
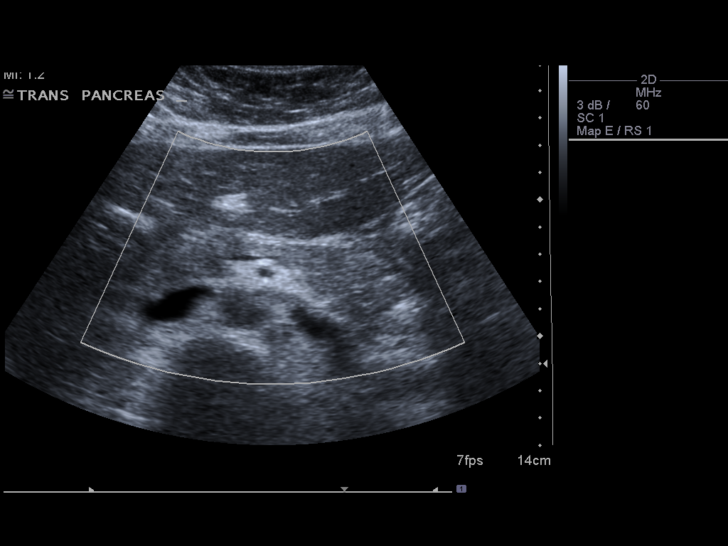
[im 5/50]
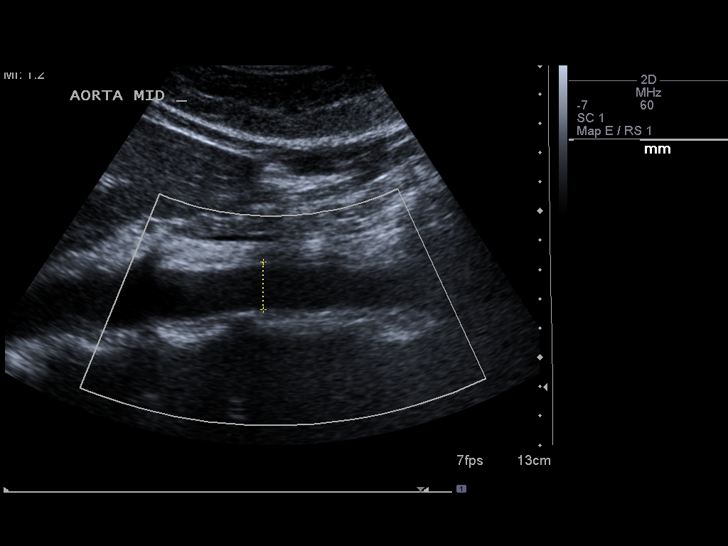
[im 9/50]
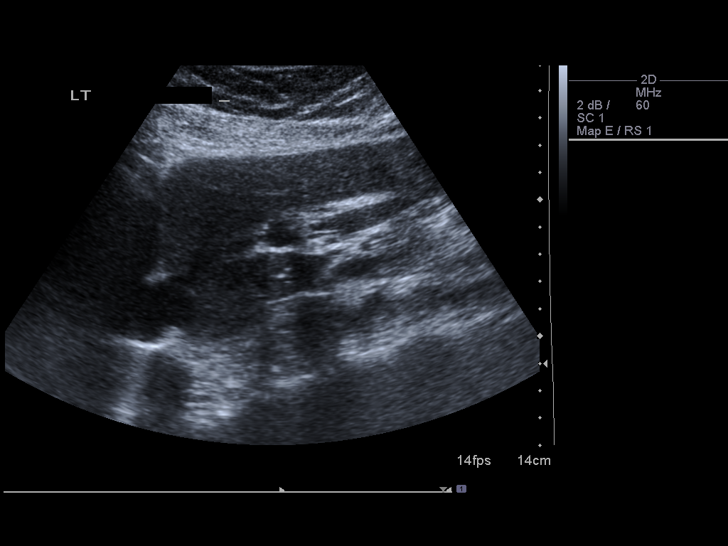
[im 13/50]
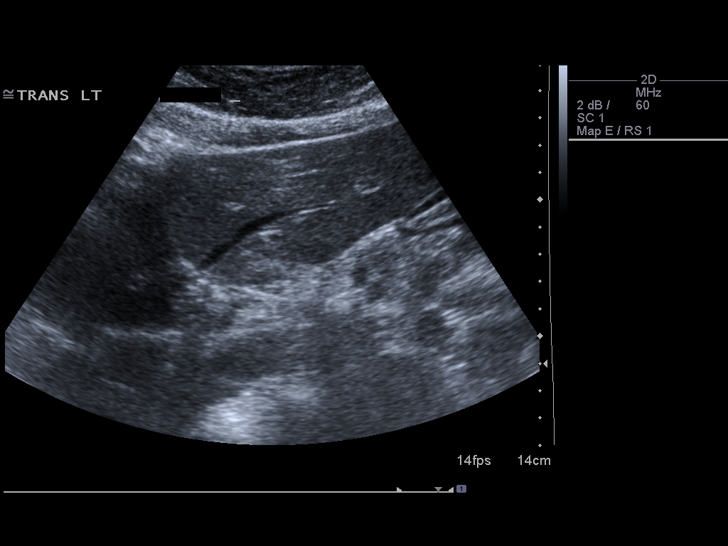
[im 17/50]
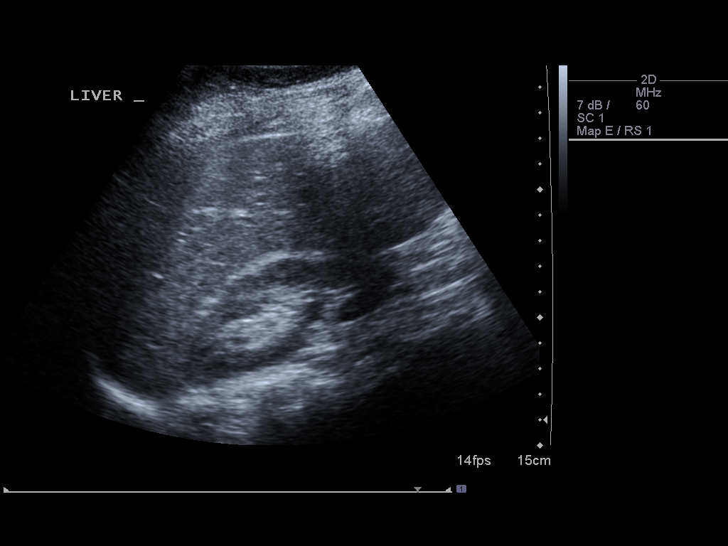
[im 19/50]
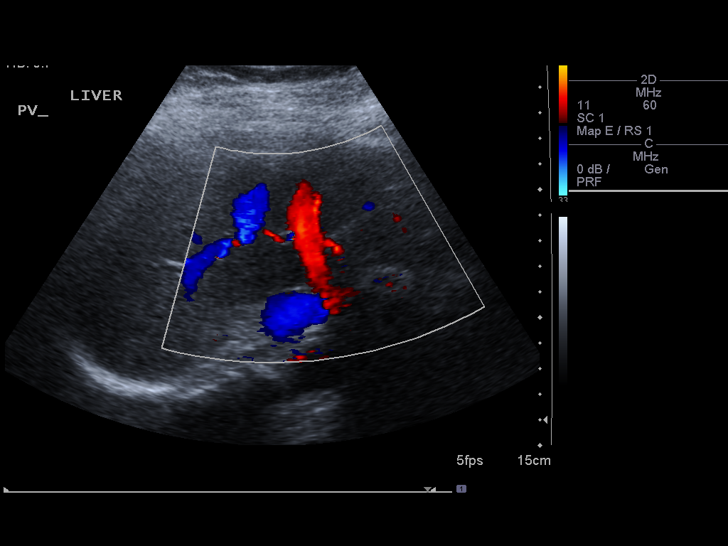
[im 23/50]
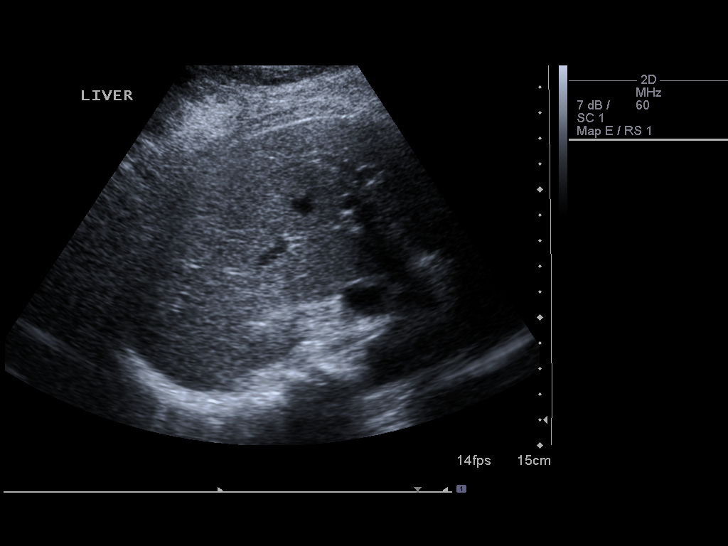
[im 27/50]
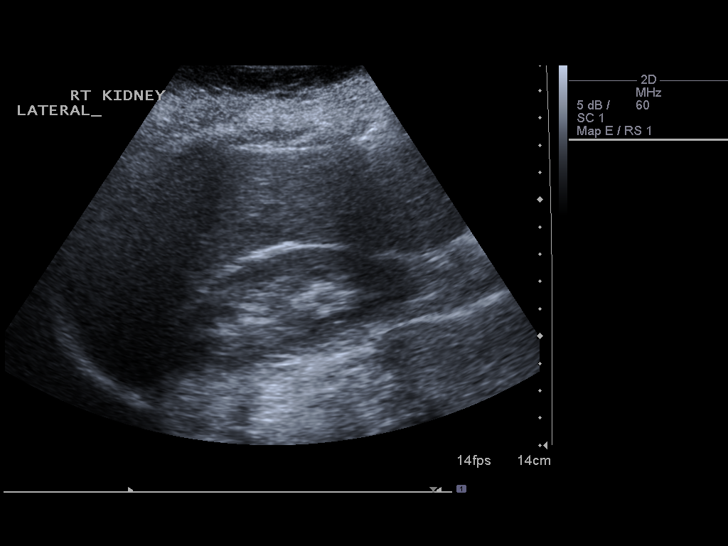
[im 31/50]
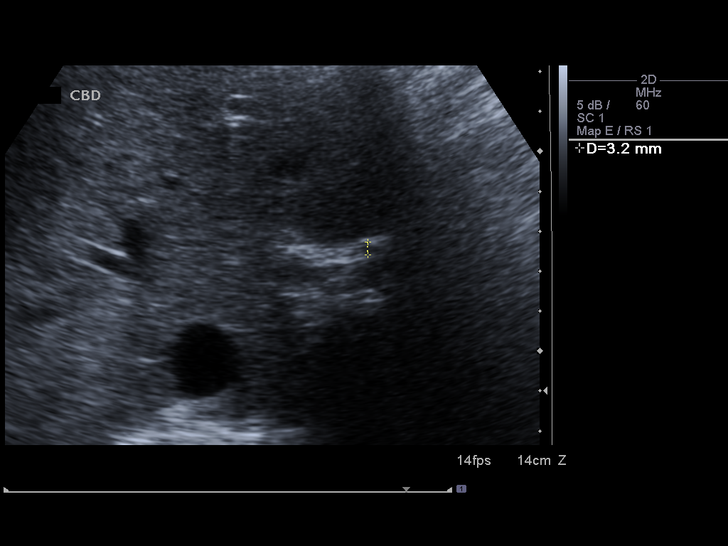
[im 33/50]
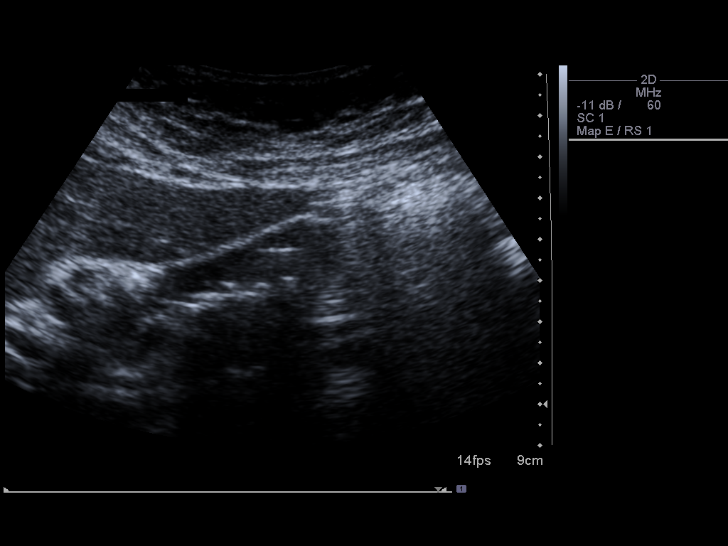
[im 37/50]
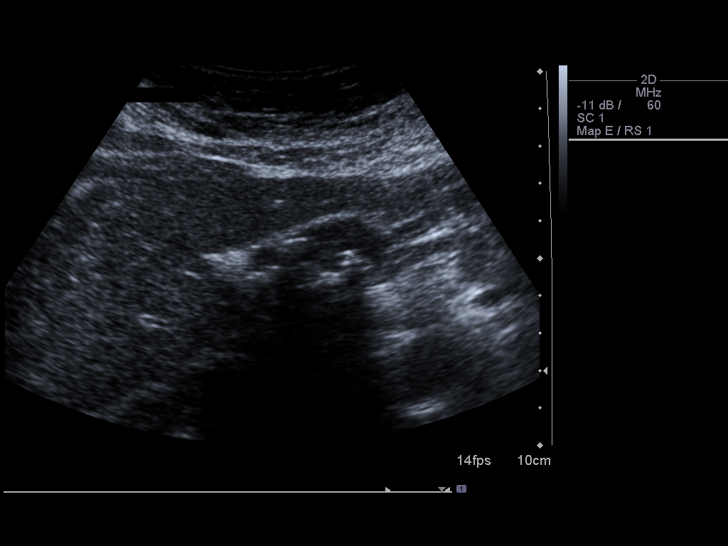
[im 41/50]
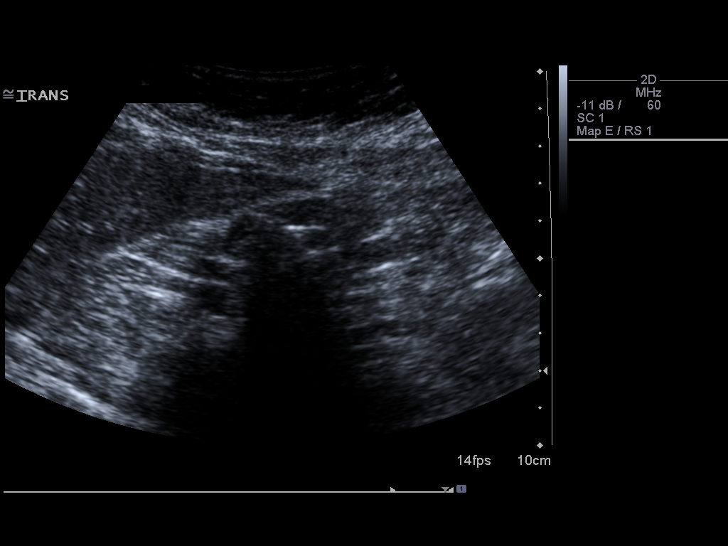
[im 45/50]
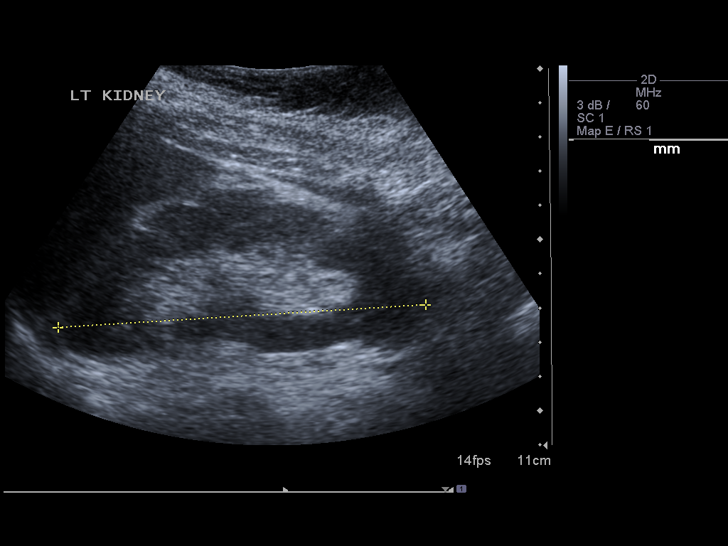
[im 50/50]
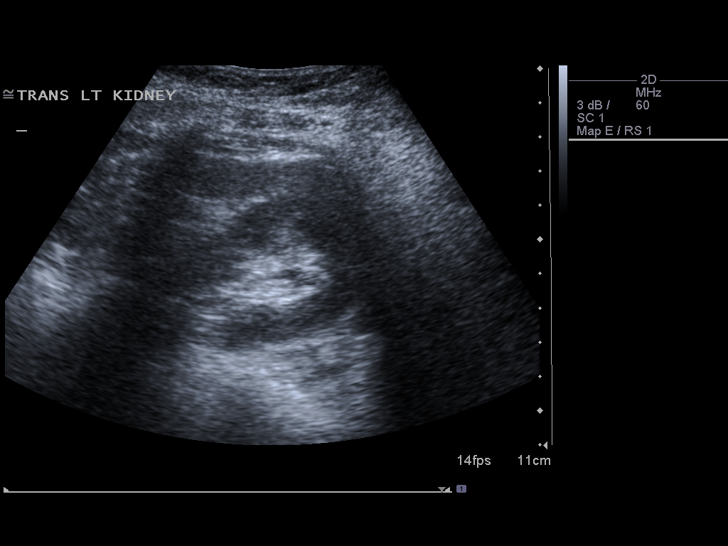

[14 of 25 positions shown; findings below may reference images not displayed]

FINDINGS: Gallbladder:  Numerous gallstones.  No wall thickening or
pericholecystic fluid. Sonographic Murphy's sign was not elicited.

Common bile duct: Normal, 3 mm.

Liver: Normal in echogenicity, without focal lesion.

IVC: Negative

Pancreas:  Negative

Spleen:  Normal in size and echogenicity.

Right Kidney:  10.9 cm. No hydronephrosis.

Left Kidney:  10.8 cm. No hydronephrosis.

Abdominal aorta:  Nonaneurysmal without ascites.
IMPRESSION: Numerous gallstones.  No evidence of acute cholecystitis or biliary
ductal dilatation.

## 2013-07-19 ENCOUNTER — Other Ambulatory Visit: Payer: Self-pay

## 2013-07-19 DIAGNOSIS — Z1231 Encounter for screening mammogram for malignant neoplasm of breast: Secondary | ICD-10-CM

## 2013-07-30 ENCOUNTER — Ambulatory Visit
Admission: RE | Admit: 2013-07-30 | Discharge: 2013-07-30 | Disposition: A | Discharge status: Home / Self Care | Payer: BC Managed Care – PPO | Source: Ambulatory Visit

## 2013-07-30 DIAGNOSIS — Z1231 Encounter for screening mammogram for malignant neoplasm of breast: Secondary | ICD-10-CM

## 2014-03-10 ENCOUNTER — Emergency Department (HOSPITAL_COMMUNITY)
Admission: EM | Admit: 2014-03-10 | Discharge: 2014-03-10 | Disposition: A | Discharge status: Home / Self Care | Payer: Worker's Compensation | Attending: Emergency Medicine | Admitting: Emergency Medicine

## 2014-03-10 DIAGNOSIS — Y9389 Activity, other specified: Secondary | ICD-10-CM | POA: Diagnosis not present

## 2014-03-10 DIAGNOSIS — S0990XA Unspecified injury of head, initial encounter: Secondary | ICD-10-CM | POA: Diagnosis present

## 2014-03-10 DIAGNOSIS — Z79899 Other long term (current) drug therapy: Secondary | ICD-10-CM | POA: Insufficient documentation

## 2014-03-10 DIAGNOSIS — S060X0A Concussion without loss of consciousness, initial encounter: Secondary | ICD-10-CM | POA: Diagnosis not present

## 2014-03-10 DIAGNOSIS — Z87891 Personal history of nicotine dependence: Secondary | ICD-10-CM | POA: Insufficient documentation

## 2014-03-10 DIAGNOSIS — IMO0002 Reserved for concepts with insufficient information to code with codable children: Secondary | ICD-10-CM | POA: Diagnosis not present

## 2014-03-10 DIAGNOSIS — W010XXA Fall on same level from slipping, tripping and stumbling without subsequent striking against object, initial encounter: Secondary | ICD-10-CM | POA: Diagnosis not present

## 2014-03-10 DIAGNOSIS — Z8719 Personal history of other diseases of the digestive system: Secondary | ICD-10-CM | POA: Diagnosis not present

## 2014-03-10 DIAGNOSIS — S61409A Unspecified open wound of unspecified hand, initial encounter: Secondary | ICD-10-CM | POA: Diagnosis not present

## 2014-03-10 DIAGNOSIS — Y9289 Other specified places as the place of occurrence of the external cause: Secondary | ICD-10-CM | POA: Diagnosis not present

## 2014-03-10 MED ORDER — IBUPROFEN 800 MG PO TABS: 800.0000 mg | ORAL_TABLET | Freq: Three times a day (TID) | ORAL | Status: DC | PRN

## 2014-03-10 MED ORDER — HYDROCODONE-ACETAMINOPHEN 5-325 MG PO TABS
1.0000 | ORAL_TABLET | Freq: Once | ORAL | Status: DC
  Filled 2014-03-10: qty 1

## 2014-03-10 MED ORDER — IBUPROFEN 800 MG PO TABS
800.0000 mg | ORAL_TABLET | Freq: Once | ORAL | Status: AC
  Administered 2014-03-10: 800 mg via ORAL
  Filled 2014-03-10: qty 1

## 2014-03-10 NOTE — ED Provider Notes (Signed)
CSN: [id number]     Arrival date & time 03/10/14  1339 History   First MD Initiated Contact with Patient 03/10/14 1352     Chief Complaint  Patient presents with  . Fall     (Consider location/radiation/quality/duration/timing/severity/associated sxs/prior Treatment) HPI Patient presents to the emergency department following a fall that occurred just prior to arrival.  Patient, states she stumbled over the curbing in a parking lot and hit her face and skinned her knees and patient has mild abrasion to the bridge of the nose and area above her upper lip.  Patient reports feeling like her left central incisor is somewhat loose.  Patient, states she did not have loss consciousness, chest pain, shortness of breath, nausea, vomiting, blurred vision, weakness, dizziness, lightheadedness, back pain, neck pain, abdominal pain, or numbness.  The patient, states, that her tetanus shot is up-to-date.  Patient, states, that nothing seems to make her condition, better or worse Past Medical History  Diagnosis Date  . Gallstones    Past Surgical History  Procedure Laterality Date  . Carpal tunnel release  1987    bilateral  . Cholecystectomy  05/31/2011    Procedure: LAPAROSCOPIC CHOLECYSTECTOMY WITH INTRAOPERATIVE CHOLANGIOGRAM;  Surgeon: Willey Blade, MD;  Location: [facility];  Service: General;  Laterality: N/A;   Family History  Problem Relation Age of Onset  . Cancer Mother     breast  . Cancer Maternal Aunt     breast, colon   History  Substance Use Topics  . Smoking status: Former Research scientist (life sciences)  . Smokeless tobacco: Never Used     Comment: quit 1996  . Alcohol Use: No   OB History   Grav Para Term Preterm Abortions TAB SAB Ect Mult Living                 Review of Systems  All other systems negative except as documented in the HPI. All pertinent positives and negatives as reviewed in the HPI.   Allergies  Review of patient's allergies indicates no known allergies.  Home  Medications   Prior to Admission medications   Medication Sig Start Date End Date Taking? Authorizing Provider  BIOTIN FORTE PO Take 1 tablet by mouth every other day.     Historical Provider, MD  Calcium Carbonate-Vit D-Min (CALTRATE PLUS PO) Take 1 tablet by mouth every other day.     Historical Provider, MD  cholecalciferol (VITAMIN D) 1000 UNITS tablet Take 1,000 Units by mouth every other day.     Historical Provider, MD  OVER THE COUNTER MEDICATION Apply 1 application topically 2 (two) times daily. Proactiv's skin care    Historical Provider, MD  tetrahydrozoline 0.05 % ophthalmic solution Place 1 drop into both eyes daily.     Historical Provider, MD   BP 161/95  Pulse 68  Temp(Src) 98.2 F (36.8 C) (Oral)  Resp 18  SpO2 98% Physical Exam  Nursing note and vitals reviewed. Constitutional: She is oriented to person, place, and time. She appears well-developed and well-nourished. No distress.  HENT:  Head: Normocephalic. Head is with abrasion and with contusion.    Right Ear: Tympanic membrane normal.  Left Ear: Tympanic membrane normal.  Mouth/Throat: Uvula is midline and oropharynx is clear and moist.    Neck: Normal range of motion. Neck supple.  Cardiovascular: Normal rate, regular rhythm and normal heart sounds.  Exam reveals no gallop and no friction rub.   No murmur heard. Pulmonary/Chest: Effort normal and breath sounds normal. No  respiratory distress. She exhibits no tenderness.  Musculoskeletal:       Right hip: Normal.       Left hip: Normal.       Right knee: Normal.       Left knee: She exhibits normal range of motion, no swelling, no effusion, no ecchymosis, no deformity and no bony tenderness.       Cervical back: Normal.       Thoracic back: Normal.       Lumbar back: Normal.       Hands:      Legs: Neurological: She is alert and oriented to person, place, and time. She has normal strength. No sensory deficit. She exhibits normal muscle tone.  Coordination and gait normal. GCS eye subscore is 4. GCS verbal subscore is 5. GCS motor subscore is 6.    ED Course  Procedures (including critical care time)   Explained to the patient.  She most likely has a mild concussion.  Patient did not have loss consciousness and has normal mentation and orientation at this time.  She has no neurological deficits noted on exam.  I find the patient that at this time.  We did not need a CT scan and advised her to return to strict precautions for any worsening in her condition.  Patient is comfortable with the plan and all questions were answered  Brent General, PA-C 03/10/14 1446

## 2014-03-10 NOTE — Discharge Instructions (Signed)
Return here for any worsening in your condition.  Followup with your primary care Dr. keep the areas of abrasion clean and dry

## 2014-03-10 NOTE — ED Notes (Signed)
PA student at bedside.

## 2014-03-10 NOTE — ED Notes (Signed)
Per EMS- fall from standing position (tripped and fell over ramp). Denies LOC but did hit her head. Not on blood thinners. Denies neck or back pain. Passed SCCA. Denies nausea. PERRLA. No vision changes noted. Has several small, superficial facial bruises. No active bleeding or deformities seen. C/o bilateral knee pain described as throbbing. Small laceration to left knee and laceration on right inner hand cleansed with NS. Also c/o head pain-all pain rated 9/10. Has full ROM. Ambulatory at scene. C/o left wrist pain en route. Full ROM with left wrist. No obvious deformities. VS: BP 160/100 HR 88 regular RR 18 SpO2 98% on RA.

## 2014-03-11 NOTE — ED Provider Notes (Signed)
Medical screening examination/treatment/procedure(s) were performed by non-physician practitioner and as supervising physician I was immediately available for consultation/collaboration.    Johnna Acosta, MD 03/11/14 [phone]

## 2014-07-29 ENCOUNTER — Other Ambulatory Visit: Payer: Self-pay | Admitting: Family Medicine

## 2014-07-29 ENCOUNTER — Other Ambulatory Visit (HOSPITAL_COMMUNITY)
Admission: RE | Admit: 2014-07-29 | Discharge: 2014-07-29 | Disposition: A | Discharge status: Home / Self Care | Payer: BC Managed Care – PPO | Source: Ambulatory Visit | Attending: Family Medicine | Admitting: Family Medicine

## 2014-07-29 DIAGNOSIS — Z124 Encounter for screening for malignant neoplasm of cervix: Secondary | ICD-10-CM | POA: Insufficient documentation

## 2014-08-02 LAB — CYTOLOGY - PAP

## 2014-10-19 ENCOUNTER — Other Ambulatory Visit: Payer: Self-pay

## 2014-10-19 DIAGNOSIS — Z1231 Encounter for screening mammogram for malignant neoplasm of breast: Secondary | ICD-10-CM

## 2014-11-17 ENCOUNTER — Ambulatory Visit
Admission: RE | Admit: 2014-11-17 | Discharge: 2014-11-17 | Disposition: A | Discharge status: Home / Self Care | Payer: BC Managed Care – PPO | Source: Ambulatory Visit

## 2014-11-17 DIAGNOSIS — Z1231 Encounter for screening mammogram for malignant neoplasm of breast: Secondary | ICD-10-CM

## 2014-11-21 ENCOUNTER — Other Ambulatory Visit: Payer: Self-pay | Admitting: Family Medicine

## 2014-11-21 DIAGNOSIS — R928 Other abnormal and inconclusive findings on diagnostic imaging of breast: Secondary | ICD-10-CM

## 2014-11-24 ENCOUNTER — Ambulatory Visit
Admission: RE | Admit: 2014-11-24 | Discharge: 2014-11-24 | Disposition: A | Discharge status: Home / Self Care | Payer: BC Managed Care – PPO | Source: Ambulatory Visit | Attending: Family Medicine | Admitting: Family Medicine

## 2014-11-24 DIAGNOSIS — R928 Other abnormal and inconclusive findings on diagnostic imaging of breast: Secondary | ICD-10-CM

## 2015-10-25 ENCOUNTER — Other Ambulatory Visit: Payer: Self-pay

## 2015-10-25 DIAGNOSIS — Z1231 Encounter for screening mammogram for malignant neoplasm of breast: Secondary | ICD-10-CM

## 2015-11-20 ENCOUNTER — Ambulatory Visit
Admission: RE | Admit: 2015-11-20 | Discharge: 2015-11-20 | Disposition: A | Discharge status: Home / Self Care | Payer: BC Managed Care – PPO | Source: Ambulatory Visit

## 2015-11-20 DIAGNOSIS — Z1231 Encounter for screening mammogram for malignant neoplasm of breast: Secondary | ICD-10-CM

## 2015-12-14 ENCOUNTER — Ambulatory Visit (INDEPENDENT_AMBULATORY_CARE_PROVIDER_SITE_OTHER): Payer: BC Managed Care – PPO

## 2015-12-14 ENCOUNTER — Ambulatory Visit (INDEPENDENT_AMBULATORY_CARE_PROVIDER_SITE_OTHER): Payer: BC Managed Care – PPO | Admitting: Physician Assistant

## 2015-12-14 VITALS — BP 134/86 | HR 86 | Temp 98.5°F | Resp 18 | Ht 63.0 in | Wt 190.0 lb

## 2015-12-14 DIAGNOSIS — S76012A Strain of muscle, fascia and tendon of left hip, initial encounter: Secondary | ICD-10-CM

## 2015-12-14 DIAGNOSIS — S76011A Strain of muscle, fascia and tendon of right hip, initial encounter: Secondary | ICD-10-CM | POA: Diagnosis not present

## 2015-12-14 DIAGNOSIS — S76019A Strain of muscle, fascia and tendon of unspecified hip, initial encounter: Secondary | ICD-10-CM

## 2015-12-14 MED ORDER — IBUPROFEN 800 MG PO TABS: 400.0000 mg | ORAL_TABLET | Freq: Three times a day (TID) | ORAL | Status: AC

## 2015-12-14 NOTE — Patient Instructions (Addendum)
You can take up to 1000 mg of TYLENOL every eight hours in addition to the ibuprofen I am prescribing.  Do not take any other type of pain medication.  Try to rest.  If symptoms not improved in 7 days then return to clinic.   Groin Strain A groin strain (also called a groin pull) is an injury to the muscles or tendon on the upper inner part of the thigh. These muscles are called the adductor muscles or groin muscles. They are responsible for moving the leg across the body. A muscle strain occurs when a muscle is overstretched and some muscle fibers are torn. A groin strain can range from mild to severe depending on how many muscle fibers are affected and whether the muscle fibers are partially or completely torn.  Groin strains usually occur during exercise or participation in sports. The injury often happens when a sudden, violent force is placed on a muscle, stretching the muscle too far. A strain is more likely to occur when your muscles are not warmed up or if you are not properly conditioned. Depending on the severity of the groin strain, recovery time may vary from a few weeks to several weeks. Severe injuries often require 4-6 weeks for recovery. In these cases, complete healing can take 4-5 months.  CAUSES   Stretching the groin muscles too far or too suddenly, often during side-to-side motion with an abrupt change in direction.  Putting repeated stress on the groin muscles over a long period of time.  Performing vigorous activity without properly stretching the groin muscles beforehand. SYMPTOMS   Pain and tenderness in the groin area. This begins as sharp pain and persists as a dull ache.  Popping or snapping feeling when the injury occurs (for severe strains).  Swelling or bruising.  Muscle spasms.  Weakness in the leg.  Stiffness in the groin area with decreased ability to move the affected muscles. DIAGNOSIS  Your caregiver will perform a physical exam to diagnose a groin  strain. You will be asked about your symptoms and how the injury occurred. X-rays are sometimes needed to rule out a broken bone or cartilage problems. Your caregiver may order a CT scan or MRI if a complete muscle tear is suspected. TREATMENT  A groin strain will often heal on its own. Your caregiver may prescribe medicines to help manage pain and swelling (anti-inflammatory medicine). You may be told to use crutches for the first few days to minimize your pain. HOME CARE INSTRUCTIONS   Rest. Do not use the strained muscle if it causes pain.  Put ice on the injured area.  Put ice in a plastic bag.  Place a towel between your skin and the bag.  Leave the ice on for 15-20 minutes, every 2-3 hours. Do this for the first 2 days after the injury.  Only take over-the-counter or prescription medicines as directed by your caregiver.  Wrap the injured area with an elastic bandage as directed by your caregiver.  Keep the injured leg raised (elevated).  Walk, stretch, and perform range-of-motion exercises to improve blood flow to the injured area. Only perform these activities if you can do so without any pain. To prevent muscle strains:  Warm up before exercise.  Develop proper conditioning and strength in the groin muscles. SEEK IMMEDIATE MEDICAL CARE IF:   You have increased pain or swelling in the affected area.   Your symptoms are not improving or are getting worse. MAKE SURE YOU:   Understand  these instructions.  Will watch your condition.  Will get help right away if you are not doing well or get worse.   This information is not intended to replace advice given to you by your health care provider. Make sure you discuss any questions you have with your health care provider.   Document Released: 01/30/2004 Document Revised: 05/20/2012 Document Reviewed: 02/05/2012 Elsevier Interactive Patient Education 2016 [employer].     IF you received an x-ray today, you will  receive an invoice from [facility]. Please contact [facility] at [phone] with questions or concerns regarding your invoice.   IF you received labwork today, you will receive an invoice from [employer]. Please contact [facility] at [phone] with questions or concerns regarding your invoice.   Our billing staff will not be able to assist you with questions regarding bills from these companies.  You will be contacted with the lab results as soon as they are available. The fastest way to get your results is to activate your My Chart account. Instructions are located on the last page of this paperwork. If you have not heard from [place] regarding the results in 2 weeks, please contact this office.

## 2015-12-14 NOTE — Progress Notes (Signed)
   12/14/2015 3:45 PM   DOB: [birth year] / MRN: [id number]  SUBJECTIVE:  Marissa Silva is a 55 y.o. female presenting for bilateral proximal and medial leg pain that started two days ago.  Reports on Saturday she feel square on her bottom after slipping from a chair that she was standing in to cut some limbs.  She was able to get up and move without any difficulty after the fall.  Her pain is worse with adduction and abduction.  She is urinating and defecating normally and denies any abnormal vaginal discharge.  She denies back pain.    She has No Known Allergies.   She  has a past medical history of Gallstones.    She  reports that she has quit smoking. She has never used smokeless tobacco. She reports that she does not drink alcohol or use illicit drugs. She  has no sexual activity history on file. The patient  has past surgical history that includes Carpal tunnel release [id number]) and Cholecystectomy (05/31/2011).  Her family history includes Cancer in her maternal aunt and mother.  Review of Systems  Constitutional: Negative for fever and chills.  Cardiovascular: Negative for chest pain.  Musculoskeletal: Positive for myalgias, joint pain and falls.  Skin: Negative for rash.  Neurological: Negative for dizziness and headaches.    Problem list and medications reviewed and updated by myself where necessary, and exist elsewhere in the encounter.   OBJECTIVE:  BP 134/86 mmHg  Pulse 86  Temp(Src) 98.5 F (36.9 C) (Oral)  Resp 18  Ht 5\' 3"  (1.6 m)  Wt 190 lb (86.183 kg)  BMI 33.67 kg/m2  SpO2 99%  Physical Exam  Constitutional: Vital signs are normal.  Abdominal: There is tenderness in the suprapubic area.  Musculoskeletal:       Legs: Lymphadenopathy:       Right: No inguinal adenopathy present.       Left: No inguinal adenopathy present.    No results found for this or any previous visit (from the past 72 hour(s)).  Dg Hips Bilat W Or W/o Pelvis 2v  12/14/2015  CLINICAL  DATA:  Strain of hip adductor muscle after fall 4 days ago. EXAM: DG HIP (WITH OR WITHOUT PELVIS) 2V BILAT COMPARISON:  None. FINDINGS: There is no evidence of hip fracture or dislocation. There is no evidence of arthropathy or other focal bone abnormality. IMPRESSION: Normal bilateral hips. Electronically Signed   By: Marijo Conception, M.D.   On: 12/14/2015 15:35    ASSESSMENT AND PLAN  [name] was seen today for leg pain.  Diagnoses and all orders for this visit:  Strain of hip adductor muscle, unspecified laterality, initial encounter: Rads reassuring.  She must have somehow strained her adductors in the course of the fall and has had a delayed onset muscle soreness (DOMS).  Advised that this will pass.  Ibuprofen prescribed and tylenol advised.  RTC in 7 days if not improved.   -     DG HIPS BILAT W OR W/O PELVIS 2V; Future    The patient was advised to call or return to clinic if she does not see an improvement in symptoms or to seek the care of the closest emergency department if she worsens with the above plan.   Philis Fendt, MHS, PA-C [facility] 12/14/2015 3:45 PM

## 2015-12-15 ENCOUNTER — Telehealth: Payer: Self-pay

## 2015-12-15 NOTE — Telephone Encounter (Signed)
RX called into pharmacy

## 2015-12-15 NOTE — Telephone Encounter (Signed)
Patient's medication was sent to that pharmacy and the pharmacy does not have anything on file. Please resend!    Patient states she was advised that she should stay out of work 4-6 weeks. Patient is requesting a note for work stating that she can't come in for 4-6 weeks.  Patient also is requesting a handicap sticker because she's going on vacation and needs to be able to use close parking spaces.  Please advise!   Phone: [phone]

## 2015-12-16 ENCOUNTER — Telehealth: Payer: Self-pay | Admitting: Emergency Medicine

## 2015-12-16 ENCOUNTER — Encounter: Payer: Self-pay | Admitting: Emergency Medicine

## 2015-12-16 NOTE — Telephone Encounter (Signed)
Pt would like crutches// advised that pt my want to check w/ insurance but DME- Crutches are available

## 2015-12-16 NOTE — Telephone Encounter (Signed)
Pt returns today for crutches due to increased bilateral hip pain s/p fall last week Xrays negative for fracture or dislocation  Requesting a 4-6 weeks out of work note Crutches given with teaching and told to take Ibuprofen and rest Note given for 3days to RTW, instructed to return if sx's worsen for reevaluation

## 2015-12-18 NOTE — Telephone Encounter (Signed)
Advise on note and sticker.

## 2015-12-20 ENCOUNTER — Encounter: Payer: Self-pay | Admitting: *Deleted

## 2015-12-20 ENCOUNTER — Telehealth: Payer: Self-pay

## 2015-12-20 NOTE — Telephone Encounter (Signed)
Patient is calling to request a note to return to work Friday. Please call when ready! [phone]

## 2015-12-20 NOTE — Telephone Encounter (Signed)
Ok to RTW on Friday?

## 2016-04-17 ENCOUNTER — Ambulatory Visit: Payer: Medicaid Other | Attending: Family Medicine | Admitting: Physical Therapy

## 2016-04-17 ENCOUNTER — Encounter: Payer: Self-pay | Admitting: Physical Therapy

## 2016-04-17 DIAGNOSIS — M25561 Pain in right knee: Secondary | ICD-10-CM | POA: Diagnosis present

## 2016-04-17 DIAGNOSIS — M25562 Pain in left knee: Secondary | ICD-10-CM | POA: Insufficient documentation

## 2016-04-17 NOTE — Therapy (Signed)
[place], [place], [address] Phone: [phone]   Fax:  [phone]  Physical Therapy Evaluation  Patient Details  Name: Marissa Silva MRN: [id number] Date of Birth: 11-20-1960 Referring Provider: Kelton Pillar, MD  Encounter Date: 04/17/2016      PT End of Session - 04/17/16 1422    Visit Number 1   Authorization Type one-time medicaid eval   PT Start Time 1417   PT Stop Time 1500   PT Time Calculation (min) 43 min   Activity Tolerance Patient tolerated treatment well   Behavior During Therapy [facility] for tasks assessed/performed      Past Medical History:  Diagnosis Date  . Gallstones     Past Surgical History:  Procedure Laterality Date  . CARPAL TUNNEL RELEASE  1987   bilateral  . CHOLECYSTECTOMY  05/31/2011   Procedure: LAPAROSCOPIC CHOLECYSTECTOMY WITH INTRAOPERATIVE CHOLANGIOGRAM;  Surgeon: Willey Blade, MD;  Location: [facility];  Service: General;  Laterality: N/A;    There were no vitals filed for this visit.       Subjective Assessment - 04/17/16 1423    Subjective Reports L knee is worse than the R and give out occasionally. Pain with standing after being seated and has to stand still before walking. Bilateral feet swelling, noted after recieving injections. Reports lower back pain that was bothering her prior to the injury.    How long can you sit comfortably? "it depends"   How long can you walk comfortably? seemingly unlimited   Currently in Pain? Yes   Pain Score 4    Pain Location Knee   Pain Orientation Right;Left   Pain Descriptors / Indicators Tightness  pulling (rubbing hamstrings)   Pain Type Chronic pain  Fall in 2015   Aggravating Factors  sitting still and then moving toward standing   Pain Relieving Factors topical creams, ice, advil            [facility] PT Assessment - 04/17/16 0001      Assessment   Medical Diagnosis Knee pain   Referring Provider  Kelton Pillar, MD   Onset Date/Surgical Date --  2015   Hand Dominance Left   Next MD Visit late November   Prior Therapy no     Precautions   Precautions None     Restrictions   Weight Bearing Restrictions No     Balance Screen   Has the patient fallen in the past 6 months No     [place] residence   Living Arrangements Children   Additional Comments 4-5 stairs to enter home     Prior Function   Level of Independence Independent     Cognition   Overall Cognitive Status Within Functional Limits for tasks assessed     ROM / Strength   AROM / PROM / Strength Strength     Strength   Strength Assessment Site Knee;Hip   Right/Left Hip Right;Left   Right Hip Extension 5/5  flexibility lacking ROM to neutral   Right Hip ABduction 3+/5   Left Hip Extension 5/5  flexibility lacking ROM to neutral   Left Hip ABduction 4-/5   Right/Left Knee Right;Left   Right Knee Flexion 4/5   Right Knee Extension 4/5   Left Knee Flexion 4-/5   Left Knee Extension 5/5     Ambulation/Gait   Gait Comments bilat knee valgus, flat foot, lacking hip ext, limited trunk rotation  [facility] PT Treatment/Exercise - 04/17/16 0001      Exercises   Exercises Knee/Hip     Knee/Hip Exercises: Stretches   Passive Hamstring Stretch Limitations seated EOB   Quad Stretch Limitations thomas test position   Piriformis Stretch Both;30 seconds     Knee/Hip Exercises: Standing   Gait Training heel toe     Knee/Hip Exercises: Supine   Straight Leg Raises Both;10 reps     Knee/Hip Exercises: Sidelying   Hip ABduction Both;10 reps     Knee/Hip Exercises: Prone   Hip Extension Both;10 reps   Hip Extension Limitations with knee flexed                PT Education - 04/17/16 1501    Education provided Yes   Education Details anatomy of condition, POC, HEP, exercise form/rationale, [facility]   Person(s) Educated Patient    Methods Explanation;Tactile cues;Demonstration;Verbal cues;Handout   Comprehension Verbalized understanding;Returned demonstration;Verbal cues required;Tactile cues required                    Plan - 04/17/16 1502    Clinical Impression Statement Pt presents to PT with complaints of bilateral knee pain that began after falling in 2015. We discussed how the fall could have aggrivated OA symptoms but it is manageable. Pt had weakness and poor flexibility surrounding hips and knees accompanied by a flat foot, guarded gait that was increasing knee pain. Pt was able to demo appropriate heel toe gait and HEP appropriately. I provided her with a flyer for the [place] [facility] at [facility] for a resource should she like to persue further treatment. Was instructed to contact us with any furhter questions or needs.    PT Treatment/Interventions ADLs/Self Care Home Management;Therapeutic exercise;Patient/family education;Gait training   Consulted and Agree with Plan of Care Patient      Patient will benefit from skilled therapeutic intervention in order to improve the following deficits and impairments:  Pain, Abnormal gait, Postural dysfunction, Impaired flexibility, Decreased strength  Visit Diagnosis: Pain in both knees, unspecified chronicity - Plan: PT plan of care cert/re-cert     Problem List Patient Active Problem List   Diagnosis Date Noted  . Symptomatic cholelithiasis 05/21/2011    Aubrey Blackard C. Gustavia Carie PT, DPT 04/17/16 3:08 PM   [facility] [address] [place], [place], [address] Phone: [phone]   Fax:  [phone]  Name: Marissa Silva MRN: [id number] Date of Birth: 1960-08-05

## 2016-12-26 ENCOUNTER — Other Ambulatory Visit: Payer: Self-pay | Admitting: Family Medicine

## 2016-12-26 DIAGNOSIS — Z1231 Encounter for screening mammogram for malignant neoplasm of breast: Secondary | ICD-10-CM

## 2016-12-27 DIAGNOSIS — M170 Bilateral primary osteoarthritis of knee: Secondary | ICD-10-CM | POA: Insufficient documentation

## 2017-01-14 ENCOUNTER — Ambulatory Visit
Admission: RE | Admit: 2017-01-14 | Discharge: 2017-01-14 | Disposition: A | Discharge status: Home / Self Care | Payer: Medicaid Other | Source: Ambulatory Visit | Attending: Family Medicine | Admitting: Family Medicine

## 2017-01-14 DIAGNOSIS — Z1231 Encounter for screening mammogram for malignant neoplasm of breast: Secondary | ICD-10-CM

## 2017-11-05 IMAGING — MG 2D DIGITAL SCREENING BILATERAL MAMMOGRAM WITH CAD AND ADJUNCT TO
8 of 12 series · 8 of 28 positions shown · non-contrast
Comparison: Previous exam(s).

CLINICAL DATA: Screening.

EXAM:
2D DIGITAL SCREENING BILATERAL MAMMOGRAM WITH CAD AND ADJUNCT TOMO

[L CC synth-2D]
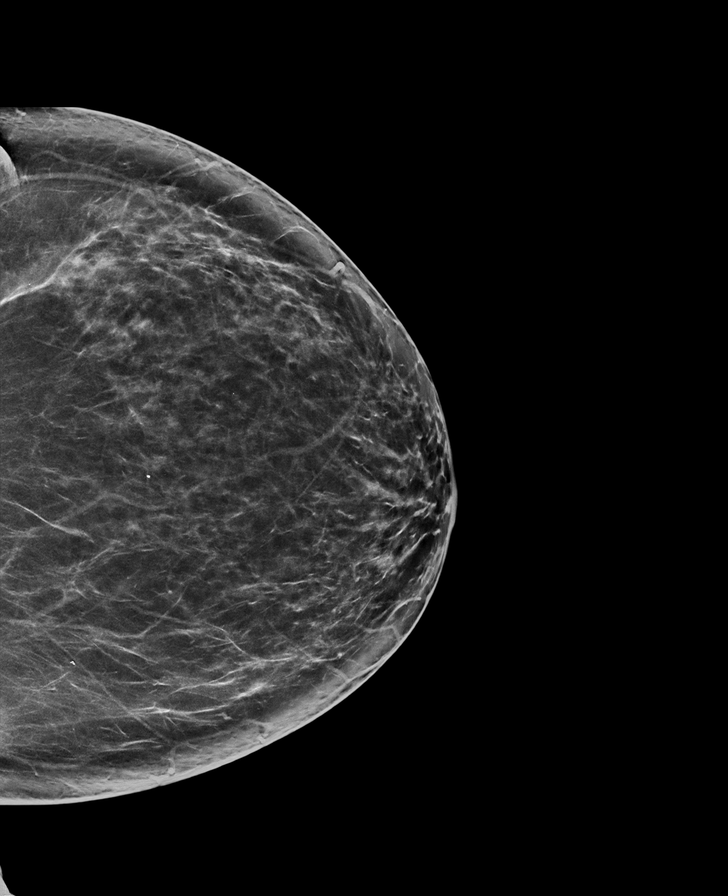

[R CC]
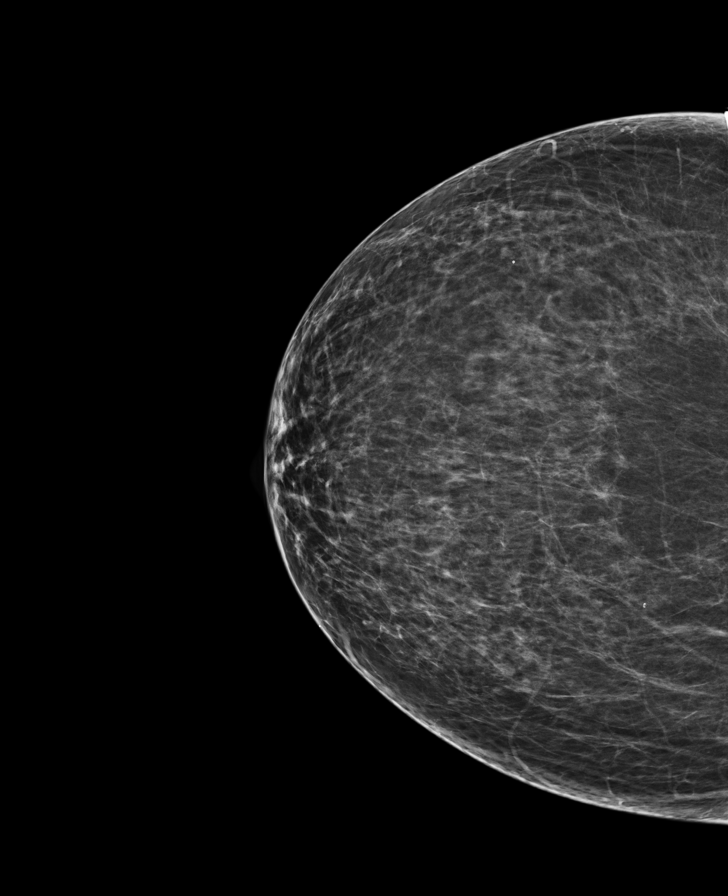

[L MLO]
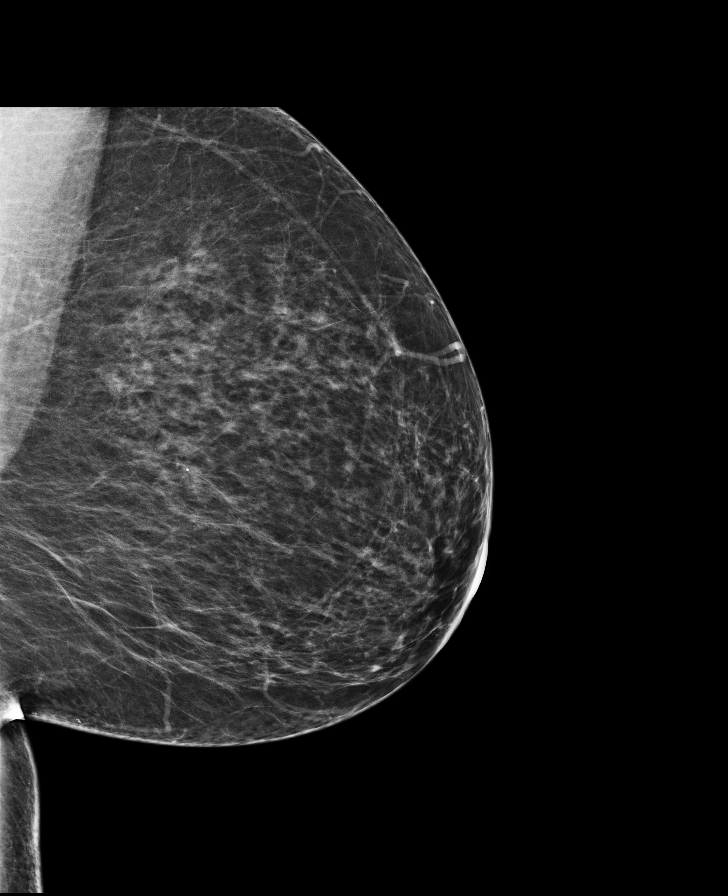

[R CC synth-2D]
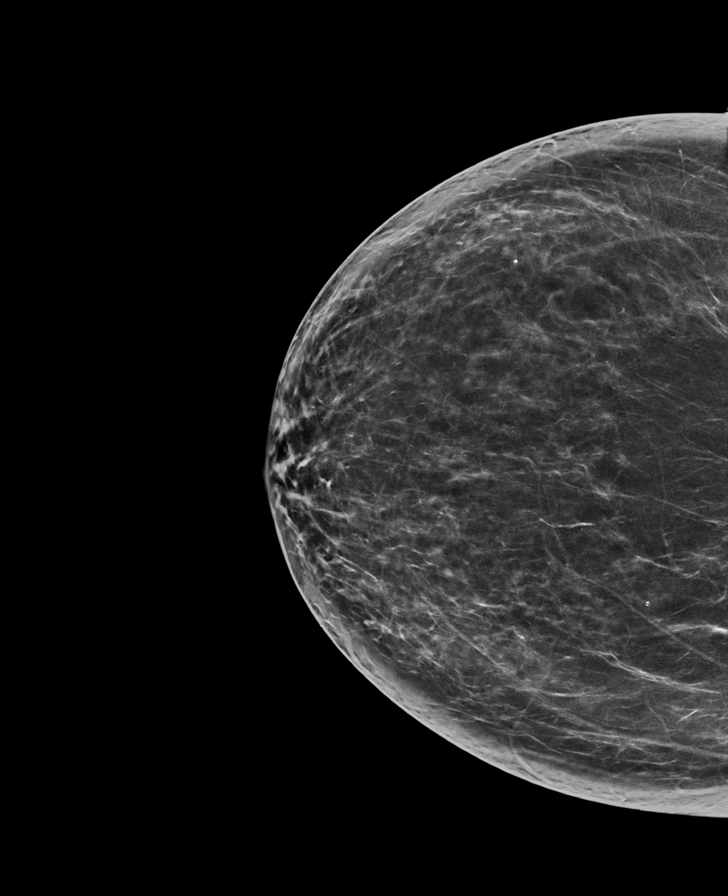

[L MLO synth-2D]
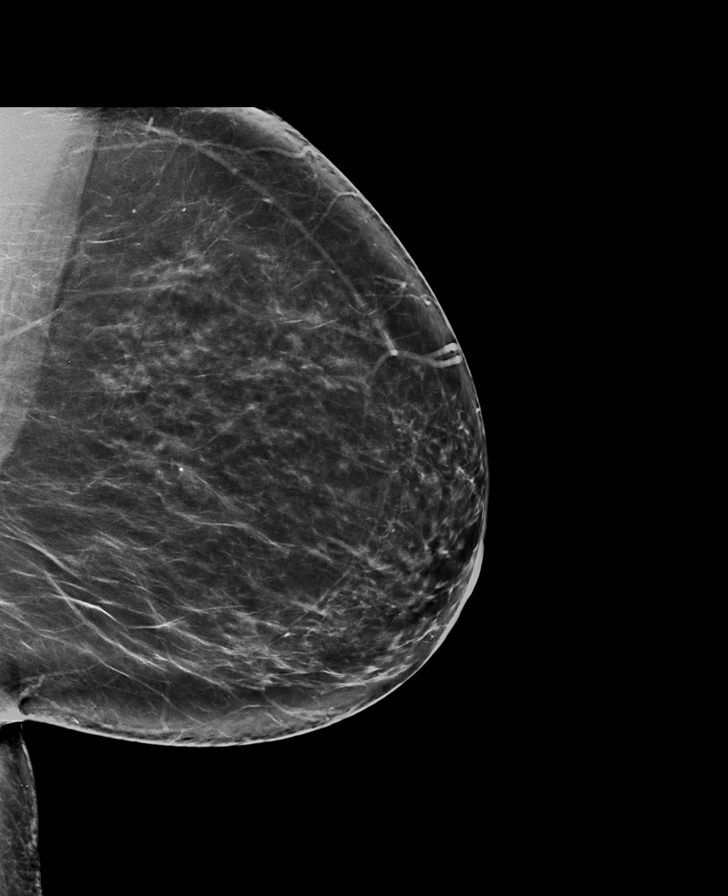

[R MLO]
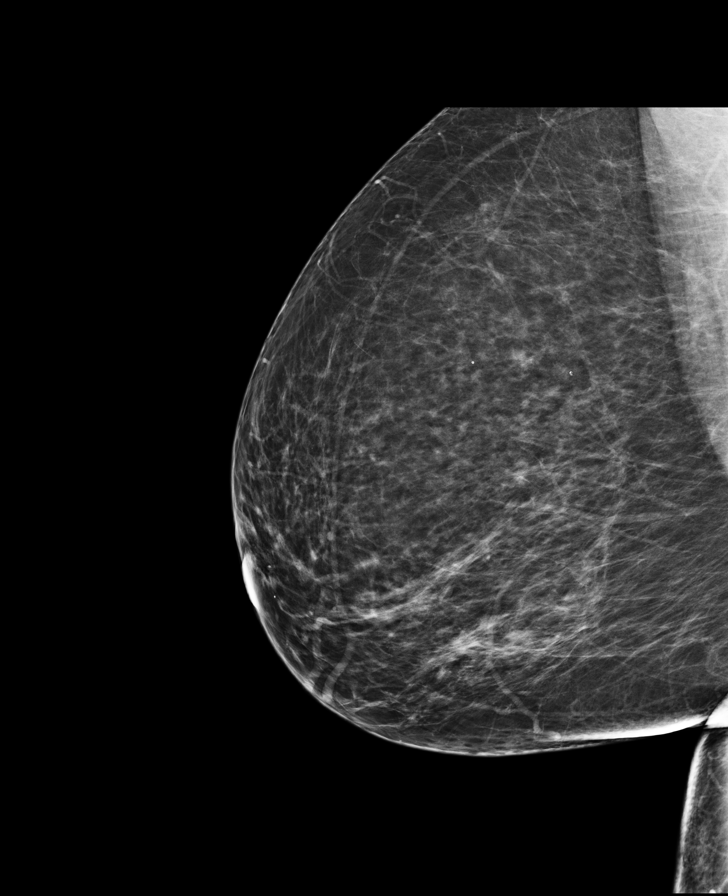

[R MLO synth-2D]
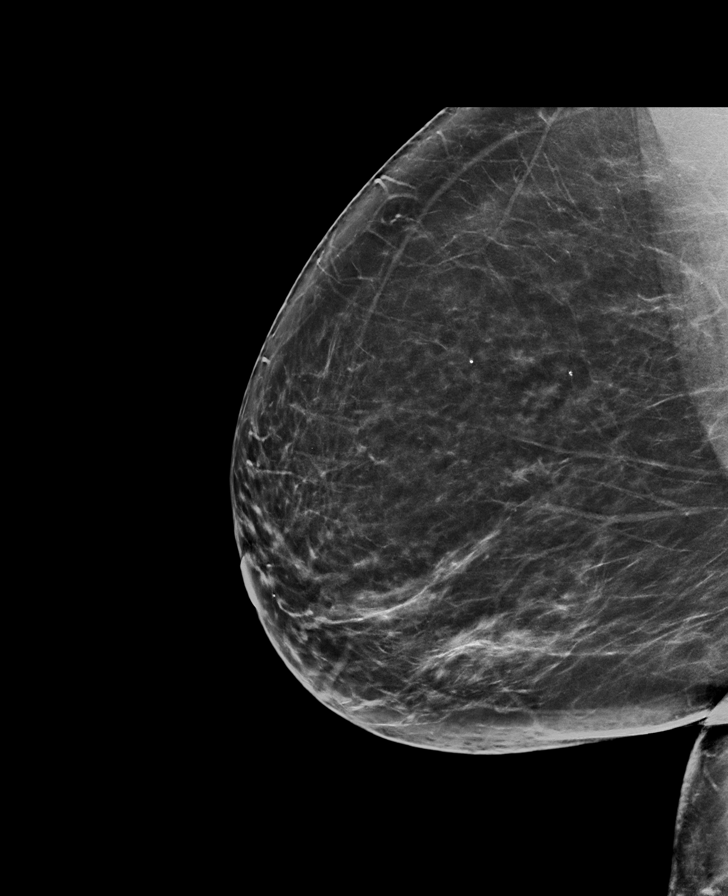

[L CC]
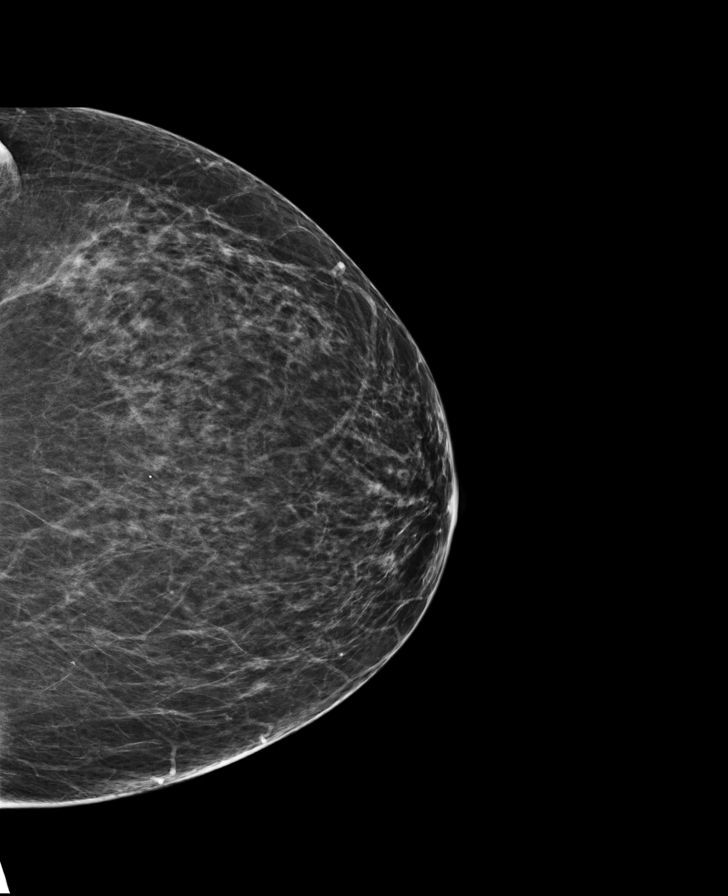

[8 of 28 positions shown; findings below may reference images not displayed]

ACR Breast Density Category b: There are scattered areas of
fibroglandular density.
FINDINGS: There are no findings suspicious for malignancy. Images were
processed with CAD.
IMPRESSION: No mammographic evidence of malignancy. A result letter of this
screening mammogram will be mailed directly to the patient.

RECOMMENDATION:
Screening mammogram in one year. (Code:97-6-RS4)

BI-RADS CATEGORY  1: Negative.

## 2017-12-30 ENCOUNTER — Encounter: Payer: Self-pay | Admitting: Pulmonary Disease

## 2017-12-30 NOTE — Telephone Encounter (Signed)
This encounter was created in error - please disregard.

## 2018-01-19 ENCOUNTER — Other Ambulatory Visit: Payer: Self-pay | Admitting: Family Medicine

## 2018-01-19 DIAGNOSIS — Z1231 Encounter for screening mammogram for malignant neoplasm of breast: Secondary | ICD-10-CM

## 2018-02-20 DIAGNOSIS — D3132 Benign neoplasm of left choroid: Secondary | ICD-10-CM | POA: Insufficient documentation

## 2018-02-20 DIAGNOSIS — H4312 Vitreous hemorrhage, left eye: Secondary | ICD-10-CM | POA: Insufficient documentation

## 2018-02-23 ENCOUNTER — Ambulatory Visit
Admission: RE | Admit: 2018-02-23 | Discharge: 2018-02-23 | Disposition: A | Discharge status: Home / Self Care | Payer: BC Managed Care – PPO | Source: Ambulatory Visit | Attending: Family Medicine | Admitting: Family Medicine

## 2018-02-23 DIAGNOSIS — Z1231 Encounter for screening mammogram for malignant neoplasm of breast: Secondary | ICD-10-CM

## 2019-01-16 DIAGNOSIS — Z8659 Personal history of other mental and behavioral disorders: Secondary | ICD-10-CM | POA: Insufficient documentation

## 2019-01-16 DIAGNOSIS — F419 Anxiety disorder, unspecified: Secondary | ICD-10-CM | POA: Insufficient documentation

## 2019-01-18 DIAGNOSIS — I10 Essential (primary) hypertension: Secondary | ICD-10-CM | POA: Insufficient documentation

## 2019-01-18 DIAGNOSIS — F411 Generalized anxiety disorder: Secondary | ICD-10-CM | POA: Insufficient documentation

## 2019-01-28 DIAGNOSIS — H2511 Age-related nuclear cataract, right eye: Secondary | ICD-10-CM | POA: Insufficient documentation

## 2019-01-28 DIAGNOSIS — H53141 Visual discomfort, right eye: Secondary | ICD-10-CM | POA: Insufficient documentation

## 2019-04-28 ENCOUNTER — Other Ambulatory Visit: Admission: RE | Admit: 2019-04-28 | Payer: BC Managed Care – PPO | Source: Ambulatory Visit

## 2019-04-29 ENCOUNTER — Other Ambulatory Visit: Payer: BC Managed Care – PPO

## 2019-12-29 DIAGNOSIS — E661 Drug-induced obesity: Secondary | ICD-10-CM | POA: Insufficient documentation

## 2022-03-14 ENCOUNTER — Other Ambulatory Visit: Payer: Self-pay | Admitting: Family Medicine

## 2022-03-14 ENCOUNTER — Encounter: Payer: Self-pay | Admitting: Podiatry

## 2022-03-14 ENCOUNTER — Ambulatory Visit (INDEPENDENT_AMBULATORY_CARE_PROVIDER_SITE_OTHER): Payer: Self-pay

## 2022-03-14 ENCOUNTER — Ambulatory Visit (INDEPENDENT_AMBULATORY_CARE_PROVIDER_SITE_OTHER): Payer: BC Managed Care – PPO | Admitting: Podiatry

## 2022-03-14 DIAGNOSIS — M7751 Other enthesopathy of right foot: Secondary | ICD-10-CM

## 2022-03-14 DIAGNOSIS — Q666 Other congenital valgus deformities of feet: Secondary | ICD-10-CM

## 2022-03-14 DIAGNOSIS — M778 Other enthesopathies, not elsewhere classified: Secondary | ICD-10-CM

## 2022-03-14 DIAGNOSIS — Z1231 Encounter for screening mammogram for malignant neoplasm of breast: Secondary | ICD-10-CM

## 2022-03-14 MED ORDER — TRIAMCINOLONE ACETONIDE 40 MG/ML IJ SUSP
20.0000 mg | Freq: Once | INTRAMUSCULAR | Status: AC
  Administered 2022-03-14: 20 mg

## 2022-03-14 MED ORDER — METHYLPREDNISOLONE 4 MG PO TBPK: ORAL_TABLET | ORAL | 0 refills | Status: DC

## 2022-03-14 MED ORDER — MELOXICAM 15 MG PO TABS: 15.0000 mg | ORAL_TABLET | Freq: Every day | ORAL | 3 refills | Status: AC

## 2022-03-15 ENCOUNTER — Ambulatory Visit
Admission: RE | Admit: 2022-03-15 | Discharge: 2022-03-15 | Disposition: A | Discharge status: Home / Self Care | Payer: Self-pay | Source: Ambulatory Visit | Attending: Family Medicine | Admitting: Family Medicine

## 2022-03-15 DIAGNOSIS — Z1231 Encounter for screening mammogram for malignant neoplasm of breast: Secondary | ICD-10-CM
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
## 2022-04-30 ENCOUNTER — Ambulatory Visit: Payer: Self-pay | Admitting: Podiatry

## 2022-06-21 ENCOUNTER — Encounter: Payer: Self-pay | Admitting: Neurology

## 2022-06-30 ENCOUNTER — Encounter (HOSPITAL_COMMUNITY): Payer: Self-pay | Admitting: Emergency Medicine

## 2022-06-30 ENCOUNTER — Emergency Department (HOSPITAL_COMMUNITY): Payer: No Typology Code available for payment source

## 2022-06-30 ENCOUNTER — Ambulatory Visit (HOSPITAL_COMMUNITY)
Admission: EM | Admit: 2022-06-30 | Discharge: 2022-06-30 | Disposition: A | Discharge status: Home / Self Care | Payer: Self-pay | Attending: Family Medicine | Admitting: Family Medicine

## 2022-06-30 ENCOUNTER — Other Ambulatory Visit: Payer: Self-pay

## 2022-06-30 ENCOUNTER — Encounter (HOSPITAL_COMMUNITY): Payer: Self-pay

## 2022-06-30 ENCOUNTER — Emergency Department (HOSPITAL_COMMUNITY)
Admission: EM | Admit: 2022-06-30 | Discharge: 2022-06-30 | Disposition: A | Discharge status: Home / Self Care | Payer: No Typology Code available for payment source | Attending: Emergency Medicine | Admitting: Emergency Medicine

## 2022-06-30 DIAGNOSIS — Z79899 Other long term (current) drug therapy: Secondary | ICD-10-CM | POA: Insufficient documentation

## 2022-06-30 DIAGNOSIS — I10 Essential (primary) hypertension: Secondary | ICD-10-CM | POA: Diagnosis not present

## 2022-06-30 DIAGNOSIS — R109 Unspecified abdominal pain: Secondary | ICD-10-CM | POA: Diagnosis present

## 2022-06-30 DIAGNOSIS — R1031 Right lower quadrant pain: Secondary | ICD-10-CM

## 2022-06-30 DIAGNOSIS — R1032 Left lower quadrant pain: Secondary | ICD-10-CM | POA: Insufficient documentation

## 2022-06-30 LAB — COMPREHENSIVE METABOLIC PANEL
ALT: 24 U/L (ref 0–44)
AST: 18 U/L (ref 15–41)
Albumin: 3.8 g/dL (ref 3.5–5.0)
Alkaline Phosphatase: 46 U/L (ref 38–126)
Anion gap: 9 (ref 5–15)
BUN: 14 mg/dL (ref 6–20)
CO2: 26 mmol/L (ref 22–32)
Calcium: 8.9 mg/dL (ref 8.9–10.3)
Chloride: 107 mmol/L (ref 98–111)
Creatinine, Ser: 0.72 mg/dL (ref 0.44–1.00)
GFR, Estimated: >60 mL/min (ref >60)
Glucose, Bld: 102 mg/dL — ABNORMAL HIGH (ref 70–99)
Potassium: 3.2 mmol/L — ABNORMAL LOW (ref 3.5–5.1)
Sodium: 142 mmol/L (ref 135–145)
Total Bilirubin: 0.4 mg/dL (ref 0.3–1.2)
Total Protein: 7.4 g/dL (ref 6.5–8.1)

## 2022-06-30 LAB — POCT URINALYSIS DIPSTICK, ED / UC
Bilirubin Urine: NEGATIVE
Glucose, UA: NEGATIVE mg/dL
Hgb urine dipstick: NEGATIVE
Ketones, ur: NEGATIVE mg/dL
Leukocytes,Ua: NEGATIVE
Nitrite: NEGATIVE
Protein, ur: NEGATIVE mg/dL
Specific Gravity, Urine: 1.025 (ref 1.005–1.030)
Urobilinogen, UA: 0.2 mg/dL (ref 0.0–1.0)
pH: 5 (ref 5.0–8.0)

## 2022-06-30 LAB — CBC
HCT: 37.1 % (ref 36.0–46.0)
Hemoglobin: 12.1 g/dL (ref 12.0–15.0)
MCH: 27.3 pg (ref 26.0–34.0)
MCHC: 32.6 g/dL (ref 30.0–36.0)
MCV: 83.7 fL (ref 80.0–100.0)
Platelets: 293 10*3/uL (ref 150–400)
RBC: 4.43 MIL/uL (ref 3.87–5.11)
RDW: 13.2 % (ref 11.5–15.5)
WBC: 7.9 10*3/uL (ref 4.0–10.5)
nRBC: 0 % (ref 0.0–0.2)

## 2022-06-30 LAB — I-STAT BETA HCG BLOOD, ED (MC, WL, AP ONLY): I-stat hCG, quantitative: <5 m[IU]/mL (ref <5)

## 2022-06-30 LAB — LIPASE, BLOOD: Lipase: 37 U/L (ref 11–51)

## 2022-06-30 MED ORDER — OXYCODONE-ACETAMINOPHEN 5-325 MG PO TABS: 1.0000 | ORAL_TABLET | Freq: Three times a day (TID) | ORAL | 0 refills | Status: AC | PRN

## 2022-06-30 MED ORDER — TIZANIDINE HCL 4 MG PO CAPS: 4.0000 mg | ORAL_CAPSULE | Freq: Three times a day (TID) | ORAL | 0 refills | Status: AC | PRN

## 2022-06-30 MED ORDER — DEXAMETHASONE SODIUM PHOSPHATE 10 MG/ML IJ SOLN
10.0000 mg | Freq: Once | INTRAMUSCULAR | Status: AC
  Administered 2022-06-30: 10 mg via INTRAVENOUS
  Filled 2022-06-30: qty 1

## 2022-06-30 MED ORDER — DIAZEPAM 5 MG PO TABS
5.0000 mg | ORAL_TABLET | Freq: Once | ORAL | Status: AC
  Administered 2022-06-30: 5 mg via ORAL
  Filled 2022-06-30: qty 1

## 2022-06-30 MED ORDER — AMLODIPINE BESYLATE 5 MG PO TABS: 5.0000 mg | ORAL_TABLET | Freq: Every day | ORAL | 0 refills | Status: AC

## 2022-06-30 MED ORDER — ACETAMINOPHEN 500 MG PO TABS: 500.0000 mg | ORAL_TABLET | Freq: Four times a day (QID) | ORAL | 0 refills | Status: AC | PRN

## 2022-06-30 MED ORDER — IBUPROFEN 600 MG PO TABS: 600.0000 mg | ORAL_TABLET | Freq: Four times a day (QID) | ORAL | 0 refills | Status: AC | PRN

## 2022-06-30 MED ORDER — HYDROMORPHONE HCL 1 MG/ML IJ SOLN
1.0000 mg | Freq: Once | INTRAMUSCULAR | Status: AC
  Administered 2022-06-30: 1 mg via INTRAVENOUS
  Filled 2022-06-30: qty 1

## 2022-06-30 MED ORDER — IOHEXOL 300 MG/ML  SOLN
100.0000 mL | Freq: Once | INTRAMUSCULAR | Status: AC | PRN
  Administered 2022-06-30: 100 mL via INTRAVENOUS

## 2022-06-30 MED ORDER — KETOROLAC TROMETHAMINE 30 MG/ML IJ SOLN
15.0000 mg | Freq: Once | INTRAMUSCULAR | Status: AC
  Administered 2022-06-30: 15 mg via INTRAVENOUS
  Filled 2022-06-30: qty 1

## 2022-06-30 NOTE — ED Triage Notes (Signed)
62 yo female c/o of lower abdominal pain x 3 days. Pt states pain is making it difficult to ambulate. Pt denies urinary or bowel symptoms at this time. Pt denies fever, nausea or vomiting.

## 2022-06-30 NOTE — ED Provider Triage Note (Signed)
Emergency Medicine Provider Triage Evaluation Note  Marissa Silva , a 62 y.o. female  was evaluated in triage.  Pt complains of right lower quadrant to right groin pain, worse with walking.  She denies nausea, vomiting, diarrhea, dysuria, hematuria, vaginal discharge.  She does report that she has some chronic back pain but denies any previous history of sciatica.  Patient was encouraged to come to the emergency department for further evaluation due to focal right lower quadrant pain..  Review of Systems  Positive: Abd pain, groin pain Negative: Fever,chills, NVD  Physical Exam  BP (!) 159/94 (BP Location: Left Arm)   Pulse 83   Temp 98.2 F (36.8 C) (Oral)   Resp 16   SpO2 97%  Gen:   Awake, no distress   Resp:  Normal effort  MSK:   Moves extremities without difficulty  Other:  Patient focally tender in right lower quadrant extending to right groin, suspicious that there could be some muscular component, however patient does have some abdominal tenderness as well, no focal lower back tenderness on my exam  Medical Decision Making  Medically screening exam initiated at 12:14 PM.  Appropriate orders placed.  Marissa Silva was informed that the remainder of the evaluation will be completed by another provider, this initial triage assessment does not replace that evaluation, and the importance of remaining in the ED until their evaluation is complete.  Workup initiated   Anselmo Pickler, [place] 06/30/22 1215

## 2022-06-30 NOTE — Discharge Instructions (Signed)
Please proceed to the emergency room for a higher level of evaluation and treatment then we can provide in the urgent care setting

## 2022-06-30 NOTE — Discharge Instructions (Addendum)
Take the medications that are prescribed.  Follow-up with the orthopedic spine surgery.  It is unclear if your pain is coming because of radiculopathy or because of musculoskeletal issues with your thigh muscles.  We think the orthopedic spine surgery would be able to best help you out.

## 2022-06-30 NOTE — ED Provider Notes (Signed)
[facility] DEPT Provider Note   CSN: [id number] Arrival date & time: 06/30/22  1100     History  Chief Complaint  Patient presents with   Abdominal Pain    Marissa Silva is a 62 y.o. female.  HPI     62 year old female comes in with chief complaint of abdominal pain.  Patient has history of lumbar spine disease, hypertension.  Patient states that on Thursday, 4 days ago she had her pelvic exam.  3 days ago she woke up with pain in the inguinal region.  She states that the pain is worse with any kind of movement, and at rest she still has some pain.  Pain is described as sharp pain.  She is having difficulty walking because of it.  There is no associated numbness, tingling.  Patient denies any UTI-like symptoms, vaginal discharge, vaginal bleeding.  She has no known gynecologic past history and denies any abnormal findings during the Pap smear.   Home Medications Prior to Admission medications   Medication Sig Start Date End Date Taking? Authorizing Provider  acetaminophen (TYLENOL) 500 MG tablet Take 1 tablet (500 mg total) by mouth every 6 (six) hours as needed. 06/30/22  Yes Varney Biles, MD  ibuprofen (ADVIL) 600 MG tablet Take 1 tablet (600 mg total) by mouth every 6 (six) hours as needed. 06/30/22  Yes Varney Biles, MD  oxyCODONE-acetaminophen (PERCOCET/ROXICET) 5-325 MG tablet Take 1 tablet by mouth every 8 (eight) hours as needed for severe pain. 06/30/22  Yes Varney Biles, MD  tiZANidine (ZANAFLEX) 4 MG capsule Take 1 capsule (4 mg total) by mouth 3 (three) times daily as needed for muscle spasms. 06/30/22  Yes Omari Mcmanaway, MD  amLODipine (NORVASC) 5 MG tablet Take 1 tablet (5 mg total) by mouth daily. 06/30/22   Varney Biles, MD  meloxicam (MOBIC) 15 MG tablet Take 1 tablet (15 mg total) by mouth daily. 03/14/22   Hyatt, Max T, DPM      Allergies    Patient has no known allergies.    Review of Systems   Review of  Systems  Physical Exam Updated Vital Signs BP (!) 153/80   Pulse 70   Temp 98.6 F (37 C) (Oral)   Resp 20   SpO2 100%  Physical Exam Vitals and nursing note reviewed.  Constitutional:      Appearance: She is well-developed.  HENT:     Head: Atraumatic.  Cardiovascular:     Rate and Rhythm: Normal rate.  Pulmonary:     Effort: Pulmonary effort is normal.  Abdominal:     Tenderness: There is abdominal tenderness.  Musculoskeletal:     Cervical back: Normal range of motion and neck supple.  Skin:    General: Skin is warm and dry.  Neurological:     Mental Status: She is alert and oriented to person, place, and time.     ED Results / Procedures / Treatments   Labs (all labs ordered are listed, but only abnormal results are displayed) Labs Reviewed  COMPREHENSIVE METABOLIC PANEL - Abnormal; Notable for the following components:      Result Value   Potassium 3.2 (*)    Glucose, Bld 102 (*)    All other components within normal limits  LIPASE, BLOOD  CBC  URINALYSIS, ROUTINE W REFLEX MICROSCOPIC  I-STAT BETA HCG BLOOD, ED ([facility], [facility], [facility] ONLY)    EKG None  Radiology CT ABDOMEN PELVIS W CONTRAST  Result Date: 06/30/2022 CLINICAL DATA:  RIGHT lower quadrant abdominal pain. EXAM: CT ABDOMEN AND PELVIS WITH CONTRAST TECHNIQUE: Multidetector CT imaging of the abdomen and pelvis was performed using the standard protocol following bolus administration of intravenous contrast. RADIATION DOSE REDUCTION: This exam was performed according to the departmental dose-optimization program which includes automated exposure control, adjustment of the mA and/or kV according to patient size and/or use of iterative reconstruction technique. CONTRAST:  177m OMNIPAQUE IOHEXOL 300 MG/ML  SOLN COMPARISON:  None Available. FINDINGS: Lower chest: No acute abnormality. Hepatobiliary: No focal liver abnormality is seen. Status post cholecystectomy. No biliary dilatation. Pancreas: Unremarkable. No  pancreatic ductal dilatation or surrounding inflammatory changes. Spleen: Normal in size without focal abnormality. Adrenals/Urinary Tract: Adrenal glands appear normal. Kidneys are unremarkable without mass, stone or hydronephrosis. No perinephric fluid. No ureteral or bladder calculi are identified. Bladder is unremarkable. Stomach/Bowel: No dilated large or small bowel loops. No evidence of focal or generalized bowel wall inflammation. Appendix appears normal. Stomach is unremarkable. Vascular/Lymphatic: No abdominal aortic aneurysm. No evidence of acute vascular abnormality. Mild aortic atherosclerosis. No enlarged lymph nodes are seen in the abdomen or pelvis. Reproductive: Uterus and bilateral adnexa are unremarkable. Other: No free fluid or abscess collection is seen. No free intraperitoneal air. Musculoskeletal: Degenerative spondylosis of the thoracolumbar spine, mild to moderate in degree, most advanced at the L5-S1 level where there is at least moderate neural foramen stenoses bilaterally. No acute-appearing osseous abnormality. IMPRESSION: 1. No acute findings within the abdomen or pelvis. No bowel obstruction or evidence of bowel wall inflammation. No evidence of acute solid organ abnormality. No renal or ureteral calculi. Appendix appears normal. 2. Degenerative spondylosis of the thoracolumbar spine, mild to moderate in degree, most advanced at the L5-S1 level where there are suspected neural foramen stenoses bilaterally. If any radiculopathic symptoms, or if the current symptoms could be radiculopathic in nature, would consider nonemergent lumbar spine MRI to exclude associated nerve root impingement. Electronically Signed   By: SFranki CabotM.D.   On: 06/30/2022 13:54    Procedures Procedures    Medications Ordered in ED Medications  iohexol (OMNIPAQUE) 300 MG/ML solution 100 mL (100 mLs Intravenous Contrast Given 06/30/22 1333)  ketorolac (TORADOL) 30 MG/ML injection 15 mg (15 mg  Intravenous Given 06/30/22 1751)  HYDROmorphone (DILAUDID) injection 1 mg (1 mg Intravenous Given 06/30/22 1751)  dexamethasone (DECADRON) injection 10 mg (10 mg Intravenous Given 06/30/22 1751)  diazepam (VALIUM) tablet 5 mg (5 mg Oral Given 06/30/22 1751)    ED Course/ Medical Decision Making/ A&P                             Medical Decision Making 62year old patient comes in with chief complaint of abdominal pain.  Patient history of hypertension and lumbar radiculopathy. Over the last 3 days she has been having significant inguinal pain, it is worse with any activity.  She is now having difficulty walking.  She has taken Tylenol for pain without significant relief.  Differential diagnosis considered includes inguinal hernia, iliopsoas strain, pubic rami stress fracture, bursitis, tendinitis and radiculopathy.  Patient has tenderness with passive leg raise and with active leg raise.  She has tenderness with rotation of the hip.  She is able to stand up and bear weight, but having significant discomfort.  She is immunocompetent.  Low suspicion for septic arthritis.  She had a CT abdomen pelvis done prior to my assessment.  CT scan does not reveal any evidence of  inguinal hernia or any intra-abdominal pathology.  We have given her strong pain medication and anti-inflammatory medication and we will reassess.  Differential diagnosis high for soft tissue strain versus lumbar radiculopathy.   7:53 PM Patient reassessed.  After the pain medicine she feels better, but not completely pain-free.  Continues to have worsening of the pain with attempts to ambulate.  We will send her home with pain medicine, it would be worthwhile for her to follow-up with orthopedic surgery to determine the specific cause for her pain.  Problems Addressed: Inguinal pain of both sides: undiagnosed new problem with uncertain prognosis  Risk OTC drugs. Prescription drug management.    Final Clinical  Impression(s) / ED Diagnoses Final diagnoses:  Inguinal pain of both sides    Rx / DC Orders ED Discharge Orders          Ordered    oxyCODONE-acetaminophen (PERCOCET/ROXICET) 5-325 MG tablet  Every 8 hours PRN        06/30/22 1938    tiZANidine (ZANAFLEX) 4 MG capsule  3 times daily PRN        06/30/22 1938    ibuprofen (ADVIL) 600 MG tablet  Every 6 hours PRN        06/30/22 1941    acetaminophen (TYLENOL) 500 MG tablet  Every 6 hours PRN        06/30/22 1942    amLODipine (NORVASC) 5 MG tablet  Daily        06/30/22 1946              Varney Biles, MD 06/30/22 1954

## 2022-06-30 NOTE — ED Provider Notes (Addendum)
[place]    CSN: [id number] Arrival date & time: 06/30/22  1001      History   Chief Complaint Chief Complaint  Patient presents with   Abdominal Pain    HPI Marissa Silva is a 62 y.o. female.    Abdominal Pain  Here for right lower quadrant pain that is been so intense that she has a hard time walking.  She states on Thursday, January 11, she had a well woman exam with a Pap smear.  She felt fine all that day and then on January 12 in the morning she began having intense right lower quadrant pain.  It is making it hard to walk.  She has not had any vomiting or fever, but her last bowel movement was on January 12.  Urinary frequency or hematuria or dysuria.  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      Home Medications    Prior to Admission medications   Medication Sig Start Date End Date Taking? Authorizing Provider  amLODipine (NORVASC) 5 MG tablet Take 5 mg by mouth daily. 02/01/22   [provider]  meloxicam (MOBIC)  15 MG tablet Take 1 tablet (15 mg total) by mouth daily. 03/14/22   Hyatt, Romilda Garret, DPM    Family History Family History  Problem Relation Age of Onset   Cancer Mother        breast   Breast cancer Mother 16   Cancer Maternal Aunt        breast, colon   Breast cancer Maternal Aunt     Social History Social History   Tobacco Use   Smoking status: Former   Smokeless tobacco: Never   Tobacco comments:    quit 1996  Substance Use Topics   Alcohol use: No   Drug use: No     Allergies   Patient has no known allergies.   Review of Systems Review of Systems  Gastrointestinal:  Positive for abdominal pain.     Physical Exam Triage Vital Signs ED Triage Vitals  Enc Vitals Group     BP 06/30/22 1025 (!) 177/90     Pulse Rate 06/30/22 1025 91     Resp 06/30/22 1025 16     Temp 06/30/22 1025 98 F (36.7 C)     Temp Source 06/30/22 1025 Oral     SpO2 06/30/22 1025 98 %     Weight --      Height --      Head Circumference --  Peak Flow --      Pain Score 06/30/22 1024 10     Pain Loc --      Pain Edu? --      Excl. in [place]? --    No data found.  Updated Vital Signs BP (!) 177/90 (BP Location: Left Arm)   Pulse 91   Temp 98 F (36.7 C) (Oral)   Resp 16   SpO2 98%   Visual Acuity Right Eye Distance:   Left Eye Distance:   Bilateral Distance:    Right Eye Near:   Left Eye Near:    Bilateral Near:     Physical Exam Vitals reviewed.  Constitutional:      General: She is not in acute distress.    Appearance: She is not ill-appearing, toxic-appearing or diaphoretic.  HENT:     Mouth/Throat:     Mouth: Mucous membranes are moist.  Eyes:     Extraocular Movements: Extraocular movements intact.     Pupils: Pupils are equal, round, and reactive to light.  Cardiovascular:     Rate and Rhythm: Normal rate and regular rhythm.     Heart sounds: No murmur heard. Pulmonary:     Effort: Pulmonary effort is normal.     Breath sounds: Normal breath sounds.   Abdominal:     General: There is no distension.     Palpations: Abdomen is soft.     Tenderness: There is abdominal tenderness (RLQ).  Neurological:     General: No focal deficit present.     Mental Status: She is oriented to person, place, and time.  Psychiatric:        Behavior: Behavior normal.      UC Treatments / Results  Labs (all labs ordered are listed, but only abnormal results are displayed) Labs Reviewed  POCT URINALYSIS DIPSTICK, ED / UC    EKG   Radiology No results found.  Procedures Procedures (including critical care time)  Medications Ordered in UC Medications - No data to display  Initial Impression / Assessment and Plan / UC Course  I have reviewed the triage vital signs and the nursing notes.  Pertinent labs & imaging results that were available during my care of the patient were reviewed by me and considered in my medical decision making (see chart for details).        Staff had the patient do a urinalysis.  It was negative.  But the intensity of her pain and the location of her pain, I feel it best that she be evaluated on an urgent basis in the emergency room.  The patient reluctantly left with her family member to proceed to the emergency room  As I was discussing her need to go to the emergency room, the patient asked if I could refill her blood pressure medication that she is out of since her blood pressure was high.  I discussed with her that she needs to be evaluated on an urgent basis at the emergency room, and since she has a PCP currently, she should talk to them about her blood pressure medicine prescription Final Clinical Impressions(s) / UC Diagnoses   Final diagnoses:  Right lower quadrant abdominal pain     Discharge Instructions      Please proceed to the emergency room for a higher level of evaluation and treatment then we can provide in the urgent care setting    ED Prescriptions   None    PDMP not reviewed this  encounter.  Barrett Henle, MD 06/30/22 1045    Barrett Henle, MD 06/30/22 1046

## 2022-06-30 NOTE — ED Triage Notes (Signed)
Pt is here for abdominal pain x 2days causing pain and discomfort

## 2022-07-03 NOTE — Progress Notes (Deleted)
Initial neurology clinic note  SERVICE DATE: 07/10/21  Reason for Evaluation: Consultation requested by Bartholome Bill, MD for an opinion regarding hand weakness and paresthesias. My final recommendations will be communicated back to the requesting physician by way of shared medical record or letter to requesting physician via [place] mail.  HPI: This is [name], a 62 y.o. ***-handed female with a medical history of HTN, vit D deficiency, chronic low back pain*** who presents to neurology clinic with the chief complaint of hand weakness and paresthesias***. The patient is accompanied by ***.  *** Left trigger finger Weakness in right hand Had CTS surgery in 1980s No clear neck pain or radicular symptoms  Does have hip and back pain  The patient has not*** had similar episodes of symptoms in the past. ***  Muscle bulk loss? *** Muscle pain? ***  Cramps/Twitching? *** Suggestion of myotonia/difficulty relaxing after contraction? ***  Fatigable weakness?*** Does strength improve after brief exercise?***  Able to brush hair/teeth without difficulty? *** Able to button shirts/use zips? *** Clumsiness/dropping grasped objects?*** Can you arise from squatted position easily? *** Able to get out of chair without using arms? *** Able to walk up steps easily? *** Use an assistive device to walk? *** Significant imbalance with walking? *** Falls?*** Any change in urine color, especially after exertion/physical activity? ***  The patient denies*** symptoms suggestive of oculobulbar weakness including diplopia, ptosis, dysphagia, poor saliva control, dysarthria/dysphonia, impaired mastication, facial weakness/droop.  There are no*** neuromuscular respiratory weakness symptoms, particularly orthopnea>dyspnea.   Pseudobulbar affect is absent***.  The patient does not*** report symptoms referable to autonomic dysfunction including impaired sweating, heat or cold intolerance,  excessive mucosal dryness, gastroparetic early satiety, postprandial abdominal bloating, constipation, bowel or bladder dyscontrol, erectile dysfunction*** or syncope/presyncope/orthostatic intolerance.  There are no*** complaints relating to other symptoms of small fiber modalities including paresthesia/pain.  The patient has not *** noticed any recent skin rashes nor does he*** report any constitutional symptoms like fever, night sweats, anorexia or unintentional weight loss.  EtOH use: ***  Restrictive diet? *** Family history of neuropathy/myopathy/NM disease?***  Previous labs, electrodiagnostics, and neuroimaging are summarized below, but pertinent findings include***  Any biopsy done? *** Current medications being tried for the patient's symptoms include ***  Prior medications that have been tried: ***   MEDICATIONS:  Outpatient Encounter Medications as of 07/10/2022  Medication Sig   acetaminophen (TYLENOL) 500 MG tablet Take 1 tablet (500 mg total) by mouth every 6 (six) hours as needed.   amLODipine (NORVASC) 5 MG tablet Take 1 tablet (5 mg total) by mouth daily.   ibuprofen (ADVIL) 600 MG tablet Take 1 tablet (600 mg total) by mouth every 6 (six) hours as needed.   meloxicam (MOBIC) 15 MG tablet Take 1 tablet (15 mg total) by mouth daily.   oxyCODONE-acetaminophen (PERCOCET/ROXICET) 5-325 MG tablet Take 1 tablet by mouth every 8 (eight) hours as needed for severe pain.   tiZANidine (ZANAFLEX) 4 MG capsule Take 1 capsule (4 mg total) by mouth 3 (three) times daily as needed for muscle spasms.   No facility-administered encounter medications on file as of 07/10/2022.    PAST MEDICAL HISTORY: Past Medical History:  Diagnosis Date   Gallstones     PAST SURGICAL HISTORY: Past Surgical History:  Procedure Laterality Date   CARPAL TUNNEL RELEASE  1987   bilateral   CHOLECYSTECTOMY  05/31/2011   Procedure: LAPAROSCOPIC CHOLECYSTECTOMY WITH INTRAOPERATIVE CHOLANGIOGRAM;   Surgeon: Willey Blade,  MD;  Location: [facility];  Service: General;  Laterality: N/A;    ALLERGIES: No Known Allergies  FAMILY HISTORY: Family History  Problem Relation Age of Onset   Cancer Mother        breast   Breast cancer Mother 6   Cancer Maternal Aunt        breast, colon   Breast cancer Maternal Aunt     SOCIAL HISTORY: Social History   Tobacco Use   Smoking status: Former   Smokeless tobacco: Never   Tobacco comments:    quit 1996  Substance Use Topics   Alcohol use: No   Drug use: No   Social History   Social History Narrative   Not on file     OBJECTIVE: PHYSICAL EXAM: There were no vitals taken for this visit.  General:*** General appearance: Awake and alert. No distress. Cooperative with exam.  Skin: No obvious rash or jaundice. HEENT: Atraumatic. Anicteric. Lungs: Non-labored breathing on room air  Heart: Regular Abdomen: Soft, non tender. Extremities: No edema. No obvious deformity.  Musculoskeletal: No obvious joint swelling. Psych: Affect appropriate.  Neurological: Mental Status: Alert. Speech fluent. No pseudobulbar affect Cranial Nerves: CNII: No RAPD. Visual fields grossly intact. CNIII, IV, VI: PERRL. No nystagmus. EOMI. CN V: Facial sensation intact bilaterally to fine touch. Mass[place] clench strong. Jaw jerk***. CN VII: Facial muscles symmetric and strong. No ptosis at rest or after sustained upgaze***. CN VIII: Hearing grossly intact bilaterally. CN IX: No hypophonia. CN X: Palate elevates symmetrically. CN XI: Full strength shoulder shrug bilaterally. CN XII: Tongue protrusion full and midline. No atrophy or fasciculations. No significant dysarthria*** Motor: Tone is ***. *** fasciculations in *** extremities. *** atrophy. No grip or percussive myotonia.***  Individual muscle group testing (MRC grade out of 5):  Movement     Neck flexion ***    Neck extension ***     Right Left   Shoulder abduction *** ***    Shoulder adduction *** ***   Shoulder ext rotation *** ***   Shoulder int rotation *** ***   Elbow flexion *** ***   Elbow extension *** ***   Wrist extension *** ***   Wrist flexion *** ***   Finger abduction - FDI *** ***   Finger abduction - ADM *** ***   Finger extension *** ***   Finger distal flexion - 2/3 *** ***   Finger distal flexion - 4/5 *** ***   Thumb flexion - FPL *** ***   Thumb abduction - APB *** ***    Hip flexion *** ***   Hip extension *** ***   Hip adduction *** ***   Hip abduction *** ***   Knee extension *** ***   Knee flexion *** ***   Dorsiflexion *** ***   Plantarflexion *** ***   Inversion *** ***   Eversion *** ***   Great toe extension *** ***   Great toe flexion *** ***     Reflexes:  Right Left   Bicep *** ***   Tricep *** ***   BrRad *** ***   Knee *** ***   Ankle *** ***    Pathological Reflexes: Babinski: *** response bilaterally*** Hoffman: *** Troemner: *** Pectoral: *** Palmomental: *** Facial: *** Midline tap: *** Sensation: Pinprick: *** Vibration: *** Temperature: *** Proprioception: *** Coordination: Intact finger-to- nose-finger bilaterally. Romberg negative.*** Gait: Able to rise from chair with arms crossed unassisted. Normal, narrow-based gait. Able to tandem walk. Able to walk on toes  and heels.***  Lab and Test Review: Internal labs: CBC (06/30/21): unremarkable CMP (06/30/21): hypokalemia (K 3.2)  External labs: 03/14/22: Normal or unremarkable: CBC, CMP Vit D: 20 ***  Imaging: ***  ASSESSMENT: Marissa Silva is a 62 y.o. female who presents for evaluation of ***. *** has a relevant medical history of ***. *** neurological examination is pertinent for ***. Available diagnostic data is significant for ***. This constellation of symptoms and objective data would most likely localize to ***. ***  PLAN: -Blood work: *** ***Vit D supplementation 1000 IU daily  -Return to clinic ***  The impression  above as well as the plan as outlined below were extensively discussed with the patient (in the company of ***) who voiced understanding. All questions were answered to their satisfaction.  The patient was counseled on pertinent fall precautions per the printed material provided today, and as noted under the "Patient Instructions" section below.***  When available, results of the above investigations and possible further recommendations will be communicated to the patient via telephone/MyChart. Patient to call office if not contacted after expected testing turnaround time.   Total time spent reviewing records, interview, history/exam, documentation, and coordination of care on day of encounter:  *** min   Thank you for allowing me to participate in patient's care.  If I can answer any additional questions, I would be pleased to do so.  Kai Levins, MD   CC: Kelton Pillar, MD [place] [address] [place] [address]  CC: Referring provider: Bartholome Bill, MD [address] [place],  [place] [address]

## 2022-07-10 ENCOUNTER — Ambulatory Visit: Payer: Self-pay | Admitting: Neurology

## 2022-07-30 NOTE — Progress Notes (Deleted)
Initial neurology clinic note  SERVICE DATE: 08/02/22  Reason for Evaluation: Consultation requested by Bartholome Bill, MD for an opinion regarding hand weakness and paresthesias. My final recommendations will be communicated back to the requesting physician by way of shared medical record or letter to requesting physician via [place] mail.  HPI: This is [name], a 62 y.o. ***-handed female with a medical history of HTN, vit D deficiency, chronic low back pain*** who presents to neurology clinic with the chief complaint of hand weakness and paresthesias***. The patient is accompanied by ***.  *** Left trigger finger Weakness in right hand Had CTS surgery in 1980s No clear neck pain or radicular symptoms   Does have hip and back pain  The patient has not*** had similar episodes of symptoms in the past. ***  Muscle bulk loss? *** Muscle pain? ***  Cramps/Twitching? *** Suggestion of myotonia/difficulty relaxing after contraction? ***  Fatigable weakness?*** Does strength improve after brief exercise?***  Able to brush hair/teeth without difficulty? *** Able to button shirts/use zips? *** Clumsiness/dropping grasped objects?*** Can you arise from squatted position easily? *** Able to get out of chair without using arms? *** Able to walk up steps easily? *** Use an assistive device to walk? *** Significant imbalance with walking? *** Falls?*** Any change in urine color, especially after exertion/physical activity? ***  The patient denies*** symptoms suggestive of oculobulbar weakness including diplopia, ptosis, dysphagia, poor saliva control, dysarthria/dysphonia, impaired mastication, facial weakness/droop.  There are no*** neuromuscular respiratory weakness symptoms, particularly orthopnea>dyspnea.   Pseudobulbar affect is absent***.  The patient does not*** report symptoms referable to autonomic dysfunction including impaired sweating, heat or cold intolerance,  excessive mucosal dryness, gastroparetic early satiety, postprandial abdominal bloating, constipation, bowel or bladder dyscontrol, erectile dysfunction*** or syncope/presyncope/orthostatic intolerance.  There are no*** complaints relating to other symptoms of small fiber modalities including paresthesia/pain.  The patient has not *** noticed any recent skin rashes nor does he*** report any constitutional symptoms like fever, night sweats, anorexia or unintentional weight loss.  EtOH use: ***  Restrictive diet? *** Family history of neuropathy/myopathy/NM disease?***  Previous labs, electrodiagnostics, and neuroimaging are summarized below, but pertinent findings include***  Any biopsy done? *** Current medications being tried for the patient's symptoms include ***  Prior medications that have been tried: ***   MEDICATIONS:  Outpatient Encounter Medications as of 08/02/2022  Medication Sig   acetaminophen (TYLENOL) 500 MG tablet Take 1 tablet (500 mg total) by mouth every 6 (six) hours as needed.   amLODipine (NORVASC) 5 MG tablet Take 1 tablet (5 mg total) by mouth daily.   ibuprofen (ADVIL) 600 MG tablet Take 1 tablet (600 mg total) by mouth every 6 (six) hours as needed.   meloxicam (MOBIC) 15 MG tablet Take 1 tablet (15 mg total) by mouth daily.   oxyCODONE-acetaminophen (PERCOCET/ROXICET) 5-325 MG tablet Take 1 tablet by mouth every 8 (eight) hours as needed for severe pain.   tiZANidine (ZANAFLEX) 4 MG capsule Take 1 capsule (4 mg total) by mouth 3 (three) times daily as needed for muscle spasms.   No facility-administered encounter medications on file as of 08/02/2022.    PAST MEDICAL HISTORY: Past Medical History:  Diagnosis Date   Gallstones     PAST SURGICAL HISTORY: Past Surgical History:  Procedure Laterality Date   CARPAL TUNNEL RELEASE  1987   bilateral   CHOLECYSTECTOMY  05/31/2011   Procedure: LAPAROSCOPIC CHOLECYSTECTOMY WITH INTRAOPERATIVE CHOLANGIOGRAM;   Surgeon: Orson Ape  Rise Patience, MD;  Location: [facility];  Service: General;  Laterality: N/A;    ALLERGIES: No Known Allergies  FAMILY HISTORY: Family History  Problem Relation Age of Onset   Cancer Mother        breast   Breast cancer Mother 52   Cancer Maternal Aunt        breast, colon   Breast cancer Maternal Aunt     SOCIAL HISTORY: Social History   Tobacco Use   Smoking status: Former   Smokeless tobacco: Never   Tobacco comments:    quit 1996  Substance Use Topics   Alcohol use: No   Drug use: No   Social History   Social History Narrative   Not on file     OBJECTIVE: PHYSICAL EXAM: There were no vitals taken for this visit.  General:*** General appearance: Awake and alert. No distress. Cooperative with exam.  Skin: No obvious rash or jaundice. HEENT: Atraumatic. Anicteric. Lungs: Non-labored breathing on room air  Heart: Regular Abdomen: Soft, non tender. Extremities: No edema. No obvious deformity.  Musculoskeletal: No obvious joint swelling. Psych: Affect appropriate.  Neurological: Mental Status: Alert. Speech fluent. No pseudobulbar affect Cranial Nerves: CNII: No RAPD. Visual fields grossly intact. CNIII, IV, VI: PERRL. No nystagmus. EOMI. CN V: Facial sensation intact bilaterally to fine touch. [place] clench strong. Jaw jerk***. CN VII: Facial muscles symmetric and strong. No ptosis at rest or after sustained upgaze***. CN VIII: Hearing grossly intact bilaterally. CN IX: No hypophonia. CN X: Palate elevates symmetrically. CN XI: Full strength shoulder shrug bilaterally. CN XII: Tongue protrusion full and midline. No atrophy or fasciculations. No significant dysarthria*** Motor: Tone is ***. *** fasciculations in *** extremities. *** atrophy. No grip or percussive myotonia.***  Individual muscle group testing (MRC grade out of 5):  Movement     Neck flexion ***    Neck extension ***     Right Left   Shoulder abduction *** ***    Shoulder adduction *** ***   Shoulder ext rotation *** ***   Shoulder int rotation *** ***   Elbow flexion *** ***   Elbow extension *** ***   Wrist extension *** ***   Wrist flexion *** ***   Finger abduction - FDI *** ***   Finger abduction - ADM *** ***   Finger extension *** ***   Finger distal flexion - 2/3 *** ***   Finger distal flexion - 4/5 *** ***   Thumb flexion - FPL *** ***   Thumb abduction - APB *** ***    Hip flexion *** ***   Hip extension *** ***   Hip adduction *** ***   Hip abduction *** ***   Knee extension *** ***   Knee flexion *** ***   Dorsiflexion *** ***   Plantarflexion *** ***   Inversion *** ***   Eversion *** ***   Great toe extension *** ***   Great toe flexion *** ***     Reflexes:  Right Left   Bicep *** ***   Tricep *** ***   BrRad *** ***   Knee *** ***   Ankle *** ***    Pathological Reflexes: Babinski: *** response bilaterally*** Hoffman: *** Troemner: *** Pectoral: *** Palmomental: *** Facial: *** Midline tap: *** Sensation: Pinprick: *** Vibration: *** Temperature: *** Proprioception: *** Coordination: Intact finger-to- nose-finger bilaterally. Romberg negative.*** Gait: Able to rise from chair with arms crossed unassisted. Normal, narrow-based gait. Able to tandem walk. Able to walk on  toes and heels.***  Lab and Test Review: Internal labs: CBC (06/30/21): unremarkable CMP (06/30/21): hypokalemia (K 3.2)   External labs: 03/14/22: Normal or unremarkable: CBC, CMP Vit D: 20  07/18/22: Ferritin: 143 TSH: 0.33  07/29/22: TSH: 0.51   Imaging: Lumbar spine xray - external (07/18/22): FINDINGS: Five non-rib-bearing lumbar type vertebral bodies. Grade 1 anterolisthesis of L4 on L5 secondary to facet degenerative changes. Marked degenerative disc disease most pronounced L4-5 and L5-S1. Lower lumbar spine facet degenerative changes most pronounced L3-4, L4-5 and L5-S1. SI joints are unremarkable. Cholecystectomy  clips. Lung bases are clear.  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  ***  ASSESSMENT: MIAMI MERCHAN is a 62 y.o. female who presents for evaluation of ***. *** has a relevant medical history of ***. *** neurological examination is pertinent for ***. Available diagnostic data is significant for ***. This constellation of symptoms and objective data would most likely localize to ***. ***  PLAN: -Blood work: *** ***Vitamin D supplementation 1000 IU daily   -Return to clinic ***  The impression above as well as the plan as outlined below were extensively discussed with the patient (in the company of ***) who voiced understanding. All questions were answered to their satisfaction.  The patient was counseled on pertinent fall precautions per the printed material provided today, and as noted under the "Patient Instructions" section below.***  When available, results of the above investigations and possible further recommendations will be communicated to the patient via telephone/MyChart. Patient to call office if not contacted after expected testing turnaround time.   Total time spent reviewing records, interview, history/exam, documentation, and coordination of care on day of encounter:  *** min   Thank you for allowing me to participate in patient's care.  If I can answer any additional questions, I would be pleased to do so.  Kai Levins, MD   CC: Kelton Pillar, MD [place] [address] [place] [address]  CC: Referring provider: Bartholome Bill, MD [address] [place],  [place] [address]

## 2022-08-02 ENCOUNTER — Encounter: Payer: Self-pay | Admitting: Neurology

## 2022-08-02 ENCOUNTER — Ambulatory Visit: Payer: Self-pay | Admitting: Neurology

## 2022-08-02 DIAGNOSIS — Z029 Encounter for administrative examinations, unspecified: Secondary | ICD-10-CM

## 2022-08-12 NOTE — Progress Notes (Signed)
Initial neurology clinic note  SERVICE DATE: 08/21/22  Reason for Evaluation: Consultation requested by Kelton Pillar, MD for an opinion regarding hand weakness and paresthesias and lower back pain. My final recommendations will be communicated back to the requesting physician by way of shared medical record or letter to requesting physician via [place] mail.  HPI: This is [name], a 62 y.o. left-handed female with a medical history of HTN, vit D deficiency, chronic low back pain, and right carpal tunnel syndrome s/p release who presents to neurology clinic with the chief complaint of hand weakness and paresthesias and back pain. The patient is alone today.  Patient was having tingling in left hand. She would have to raise her hand up to get it to stop. This went away last year some time. Patient had right carpal tunnel release in the past, but the symptoms on the left she felt was different.  Patient is more concerned today with low back and bilateral hip pain and knees. She is unable to stand for long periods of time. Her back pain does not radiate down her legs. She has been told her knees are bone on bone bilaterally. She denies numbness and tingling in her lower extremities. She does have pain in her feet that feels like someone hitting her with a stick. She has been told she had plantar fasciitis. She received a shot in 02/2022 that helped for a few months. She feels weak in her legs (both). She has difficulty with stairs. She denies imbalance or falls.  She denies bowel or bladder incontinence or saddle anesthesia.   She does not report any constitutional symptoms like fever, night sweats, anorexia or unintentional weight loss.  EtOH use: None  Restrictive diet? No Family history of neuropathy/myopathy/neurologic disease? No   MEDICATIONS:  Outpatient Encounter Medications as of 08/21/2022  Medication Sig   acetaminophen (TYLENOL) 500 MG tablet Take 1 tablet (500 mg total) by  mouth every 6 (six) hours as needed.   amLODipine (NORVASC) 5 MG tablet Take 1 tablet (5 mg total) by mouth daily.   ibuprofen (ADVIL) 600 MG tablet Take 1 tablet (600 mg total) by mouth every 6 (six) hours as needed.   meloxicam (MOBIC) 15 MG tablet Take 1 tablet (15 mg total) by mouth daily. (Patient not taking: Reported on 08/21/2022)   oxyCODONE-acetaminophen (PERCOCET/ROXICET) 5-325 MG tablet Take 1 tablet by mouth every 8 (eight) hours as needed for severe pain. (Patient not taking: Reported on 08/21/2022)   tiZANidine (ZANAFLEX) 4 MG capsule Take 1 capsule (4 mg total) by mouth 3 (three) times daily as needed for muscle spasms. (Patient not taking: Reported on 08/21/2022)   No facility-administered encounter medications on file as of 08/21/2022.    PAST MEDICAL HISTORY: Past Medical History:  Diagnosis Date   Gallstones     PAST SURGICAL HISTORY: Past Surgical History:  Procedure Laterality Date   CARPAL TUNNEL RELEASE  1987   bilateral   CHOLECYSTECTOMY  05/31/2011   Procedure: LAPAROSCOPIC CHOLECYSTECTOMY WITH INTRAOPERATIVE CHOLANGIOGRAM;  Surgeon: Willey Blade, MD;  Location: [facility];  Service: General;  Laterality: N/A;    ALLERGIES: No Known Allergies  FAMILY HISTORY: Family History  Problem Relation Age of Onset   Cancer Mother        breast   Breast cancer Mother 64   Cancer Maternal Aunt        breast, colon   Breast cancer Maternal Aunt     SOCIAL HISTORY: Social  History   Tobacco Use   Smoking status: Former   Smokeless tobacco: Never   Tobacco comments:    quit 1996  Vaping Use   Vaping Use: Never used  Substance Use Topics   Alcohol use: No   Drug use: No   Social History   Social History Narrative   Are you right handed or left handed? left   Are you currently employed ? no   What is your current occupation?   Do you live at home alone?yes   Who lives with you?    What type of home do you live in: 1 story or 2 story? one    Caffeine 3-4  a day     OBJECTIVE: PHYSICAL EXAM: BP (!) 150/98   Pulse 81   Ht '5\' 3"'$  (1.6 m)   Wt 180 lb (81.6 kg)   PF 97 L/min   BMI 31.89 kg/m   General: General appearance: Awake and alert. No distress. Cooperative with exam.  Skin: No obvious rash or jaundice. HEENT: Atraumatic. Anicteric. Lungs: Non-labored breathing on room air  Extremities: No edema. No obvious deformity.  Musculoskeletal: No obvious joint swelling. Psych: Affect appropriate.  Neurological: Mental Status: Alert. Speech fluent. No pseudobulbar affect Cranial Nerves: CNII: No RAPD. Visual fields grossly intact. CNIII, IV, VI: PERRL. No nystagmus. EOMI. CN V: Facial sensation intact bilaterally to fine touch. CN VII: Facial muscles symmetric and strong. No ptosis at rest. CN VIII: Hearing grossly intact bilaterally. CN IX: No hypophonia. CN X: Palate elevates symmetrically. CN XI: Full strength shoulder shrug bilaterally. CN XII: Tongue protrusion full and midline. No atrophy or fasciculations. No significant dysarthria Motor: Tone is normal.  Individual muscle group testing (MRC grade out of 5):  Movement     Neck flexion 5    Neck extension 5     Right Left   Shoulder abduction 5 5   Elbow flexion 5 5   Elbow extension 5 5   Finger abduction - FDI 5 5   Finger abduction - ADM 5 5   Finger extension 5 5   Finger distal flexion - 2/'3 5 5   '$ Finger distal flexion - 4/'5 5 5   '$ Thumb flexion - FPL 5 5   Thumb abduction - APB 5 5    Hip flexion 5 5   Hip extension 5 5   Hip adduction 5 5   Hip abduction 5 5   Knee extension 5 5   Knee flexion 5 5   Dorsiflexion 5 5   Plantarflexion 5 5    Reflexes:  Right Left   Bicep 2+ 2+   Tricep 2+ 2+   BrRad 2+ 2+   Knee 2+ 2+   Ankle 2+ 2+    Pathological Reflexes: Babinski: flexor response bilaterally Hoffman: absent bilaterally Troemner: absent bilaterally Sensation: Pinprick: Intact in all extremities Vibration: Intact in all  extremities Proprioception: Intact in bilateral great toes Coordination: Intact finger-to- nose-finger bilaterally. Romberg negative. Gait: Able to rise from chair with arms crossed unassisted. Normal, narrow-based gait.  Lab and Test Review: Internal labs: CBC (06/30/21): unremarkable CMP (06/30/21): hypokalemia (K 3.2)   External labs: 03/14/22: Normal or unremarkable: CBC, CMP Vit D: 20  07/18/22: Ferritin: 143 TSH: 0.33  07/29/22: TSH: 0.51   Imaging: Lumbar spine xray - external (07/18/22): FINDINGS: Five non-rib-bearing lumbar type vertebral bodies. Grade 1 anterolisthesis of L4 on L5 secondary to facet degenerative changes. Marked degenerative disc disease most pronounced L4-5 and  L5-S1. Lower lumbar spine facet degenerative changes most pronounced L3-4, L4-5 and L5-S1. SI joints are unremarkable. Cholecystectomy clips. Lung bases are clear.  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   ASSESSMENT: Marissa Silva is a 62 y.o. female who presents for evaluation of left hand tingling and back pain. She has a relevant medical history of HTN, vit D deficiency, chronic low back pain, and right carpal tunnel syndrome s/p release. Her neurological examination is essentially normal today. Patient's left hand tingling have resolved months ago per patient, so the etiology of this is unclear. She is mostly concerned about back, hip, and knee pain today. There does not appear to be any neurologic deficits, including normal strength, reflexes, and sensation. These pains are likely related to MSK/OA. She would like to be referred to sports medicine.  PLAN: -Continue Vitamin D supplementation 1000 IU daily -Orthopaedic/sports medicine referral for back, hip, and knee pains  -Return to clinic as needed  The impression above as well as the plan as outlined below were extensively discussed with the patient who voiced  understanding. All questions were answered to their satisfaction.  When available, results of the above investigations and possible further recommendations will be communicated to the patient via telephone/MyChart. Patient to call office if not contacted after expected testing turnaround time.   Total time spent reviewing records, interview, history/exam, documentation, and coordination of care on day of encounter:  35 min   Thank you for allowing me to participate in patient's care.  If I can answer any additional questions, I would be pleased to do so.  Kai Levins, MD   CC: Bartholome Bill, MD [address] [place] [address]  CC: Referring provider: Kelton Pillar, MD [place] [employer] [address] [place],  [place] [address]

## 2022-08-20 DIAGNOSIS — L668 Other cicatricial alopecia: Secondary | ICD-10-CM | POA: Diagnosis not present

## 2022-08-20 DIAGNOSIS — L299 Pruritus, unspecified: Secondary | ICD-10-CM | POA: Diagnosis not present

## 2022-08-21 ENCOUNTER — Encounter: Payer: Self-pay | Admitting: Neurology

## 2022-08-21 ENCOUNTER — Ambulatory Visit: Payer: 59 | Admitting: Neurology

## 2022-08-21 VITALS — BP 150/98 | HR 81 | Ht 63.0 in | Wt 180.0 lb

## 2022-08-21 DIAGNOSIS — R200 Anesthesia of skin: Secondary | ICD-10-CM

## 2022-08-21 DIAGNOSIS — G8929 Other chronic pain: Secondary | ICD-10-CM

## 2022-08-21 DIAGNOSIS — M25551 Pain in right hip: Secondary | ICD-10-CM

## 2022-08-21 DIAGNOSIS — M545 Low back pain, unspecified: Secondary | ICD-10-CM | POA: Diagnosis not present

## 2022-08-21 DIAGNOSIS — M170 Bilateral primary osteoarthritis of knee: Secondary | ICD-10-CM | POA: Diagnosis not present

## 2022-08-21 DIAGNOSIS — R202 Paresthesia of skin: Secondary | ICD-10-CM

## 2022-08-21 DIAGNOSIS — M25552 Pain in left hip: Secondary | ICD-10-CM

## 2022-08-21 NOTE — Addendum Note (Signed)
Addended by: Renae Gloss on: 08/21/2022 01:33 PM   Modules accepted: Orders

## 2023-02-27 DIAGNOSIS — M170 Bilateral primary osteoarthritis of knee: Secondary | ICD-10-CM | POA: Diagnosis not present

## 2023-02-27 DIAGNOSIS — M47816 Spondylosis without myelopathy or radiculopathy, lumbar region: Secondary | ICD-10-CM | POA: Diagnosis not present

## 2023-03-10 DIAGNOSIS — M542 Cervicalgia: Secondary | ICD-10-CM | POA: Diagnosis not present

## 2023-03-27 DIAGNOSIS — M25562 Pain in left knee: Secondary | ICD-10-CM | POA: Diagnosis not present

## 2023-03-27 DIAGNOSIS — M25561 Pain in right knee: Secondary | ICD-10-CM | POA: Diagnosis not present

## 2023-03-27 DIAGNOSIS — M47816 Spondylosis without myelopathy or radiculopathy, lumbar region: Secondary | ICD-10-CM | POA: Diagnosis not present

## 2023-03-27 DIAGNOSIS — G8929 Other chronic pain: Secondary | ICD-10-CM | POA: Diagnosis not present

## 2023-09-09 ENCOUNTER — Other Ambulatory Visit: Payer: Self-pay | Admitting: Family Medicine

## 2023-09-09 DIAGNOSIS — Z1231 Encounter for screening mammogram for malignant neoplasm of breast: Secondary | ICD-10-CM

## 2023-09-17 ENCOUNTER — Ambulatory Visit
Admission: RE | Admit: 2023-09-17 | Discharge: 2023-09-17 | Disposition: A | Discharge status: Home / Self Care | Source: Ambulatory Visit | Attending: Family Medicine

## 2023-09-17 DIAGNOSIS — Z1231 Encounter for screening mammogram for malignant neoplasm of breast: Secondary | ICD-10-CM
